# Patient Record
Sex: Male | Born: 1940
Health system: Southern US, Community
[De-identification: ages and names within clinical notes are randomized; demographics above are authoritative.]

## PROBLEM LIST (undated history)

## (undated) DIAGNOSIS — F1010 Alcohol abuse, uncomplicated: Secondary | ICD-10-CM

## (undated) DIAGNOSIS — E785 Hyperlipidemia, unspecified: Secondary | ICD-10-CM

## (undated) DIAGNOSIS — K5790 Diverticulosis of intestine, part unspecified, without perforation or abscess without bleeding: Secondary | ICD-10-CM

## (undated) DIAGNOSIS — F03A0 Unspecified dementia, mild, without behavioral disturbance, psychotic disturbance, mood disturbance, and anxiety: Secondary | ICD-10-CM

## (undated) DIAGNOSIS — G629 Polyneuropathy, unspecified: Secondary | ICD-10-CM

## (undated) DIAGNOSIS — I10 Essential (primary) hypertension: Secondary | ICD-10-CM

## (undated) DIAGNOSIS — G4733 Obstructive sleep apnea (adult) (pediatric): Secondary | ICD-10-CM

## (undated) DIAGNOSIS — I2510 Atherosclerotic heart disease of native coronary artery without angina pectoris: Secondary | ICD-10-CM

## (undated) HISTORY — DX: Obstructive sleep apnea (adult) (pediatric): G47.33

## (undated) HISTORY — DX: Polyneuropathy, unspecified: G62.9

## (undated) HISTORY — DX: Alcohol abuse, uncomplicated: F10.10

## (undated) HISTORY — PX: COLON SURGERY: SHX602

## (undated) HISTORY — DX: Hyperlipidemia, unspecified: E78.5

## (undated) HISTORY — DX: Essential (primary) hypertension: I10

## (undated) HISTORY — DX: Atherosclerotic heart disease of native coronary artery without angina pectoris: I25.10

## (undated) HISTORY — PX: TONSILLECTOMY: SUR1361

---

## 1998-01-25 ENCOUNTER — Emergency Department (HOSPITAL_COMMUNITY): Admission: EM | Admit: 1998-01-25 | Discharge: 1998-01-25 | Payer: Self-pay | Admitting: Emergency Medicine

## 1998-03-04 ENCOUNTER — Inpatient Hospital Stay (HOSPITAL_COMMUNITY): Admission: EM | Admit: 1998-03-04 | Discharge: 1998-03-10 | Payer: Self-pay | Admitting: Emergency Medicine

## 1998-03-19 ENCOUNTER — Inpatient Hospital Stay (HOSPITAL_COMMUNITY): Admission: EM | Admit: 1998-03-19 | Discharge: 1998-03-28 | Payer: Self-pay | Admitting: *Deleted

## 1998-05-07 ENCOUNTER — Encounter: Admission: RE | Admit: 1998-05-07 | Discharge: 1998-08-05 | Payer: Self-pay | Admitting: *Deleted

## 1999-07-09 ENCOUNTER — Ambulatory Visit (HOSPITAL_BASED_OUTPATIENT_CLINIC_OR_DEPARTMENT_OTHER): Admission: RE | Admit: 1999-07-09 | Discharge: 1999-07-09 | Payer: Self-pay | Admitting: *Deleted

## 1999-10-06 ENCOUNTER — Ambulatory Visit (HOSPITAL_BASED_OUTPATIENT_CLINIC_OR_DEPARTMENT_OTHER): Admission: RE | Admit: 1999-10-06 | Discharge: 1999-10-06 | Payer: Self-pay | Admitting: *Deleted

## 2003-01-23 ENCOUNTER — Ambulatory Visit (HOSPITAL_COMMUNITY): Admission: RE | Admit: 2003-01-23 | Discharge: 2003-01-23 | Payer: Self-pay | Admitting: Gastroenterology

## 2003-09-18 ENCOUNTER — Inpatient Hospital Stay (HOSPITAL_COMMUNITY): Admission: EM | Admit: 2003-09-18 | Discharge: 2003-09-22 | Payer: Self-pay | Admitting: Emergency Medicine

## 2003-09-18 IMAGING — CR DG ABDOMEN ACUTE W/ 1V CHEST
3 series · 3 of 3 positions shown · non-contrast
Comparison: none.

CLINICAL DATA: right-sided abdominal pain
 ACUTE ABDOMEN SERIES (TWO VIEW ABDOMEN WITH ONE VIEW CHEST) [DATE] at [2E] hours:

[view not recorded (1 of 3)]
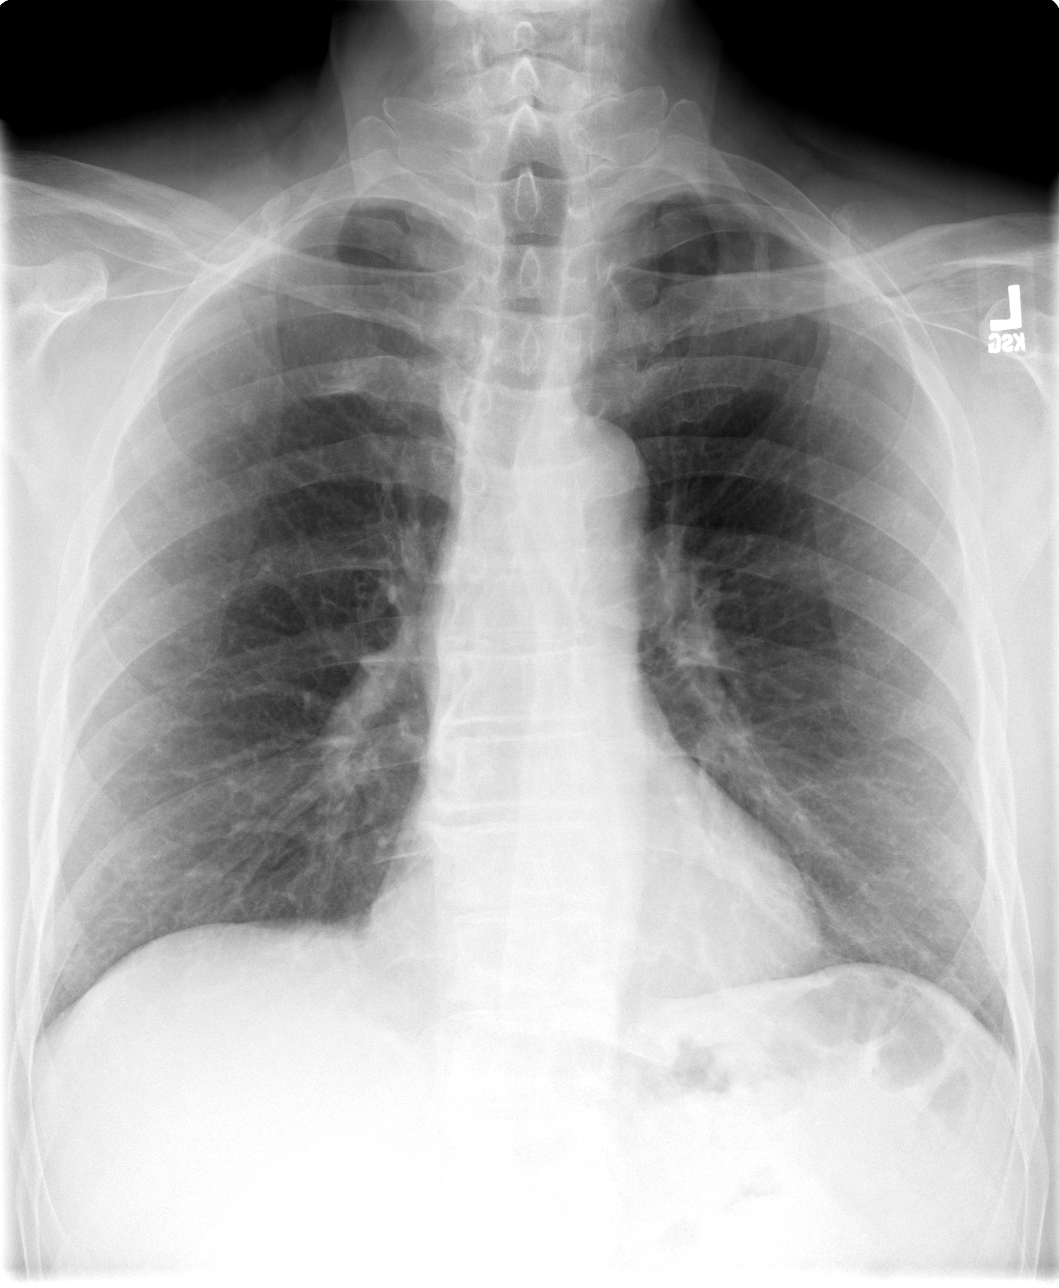

[view not recorded (2 of 3)]
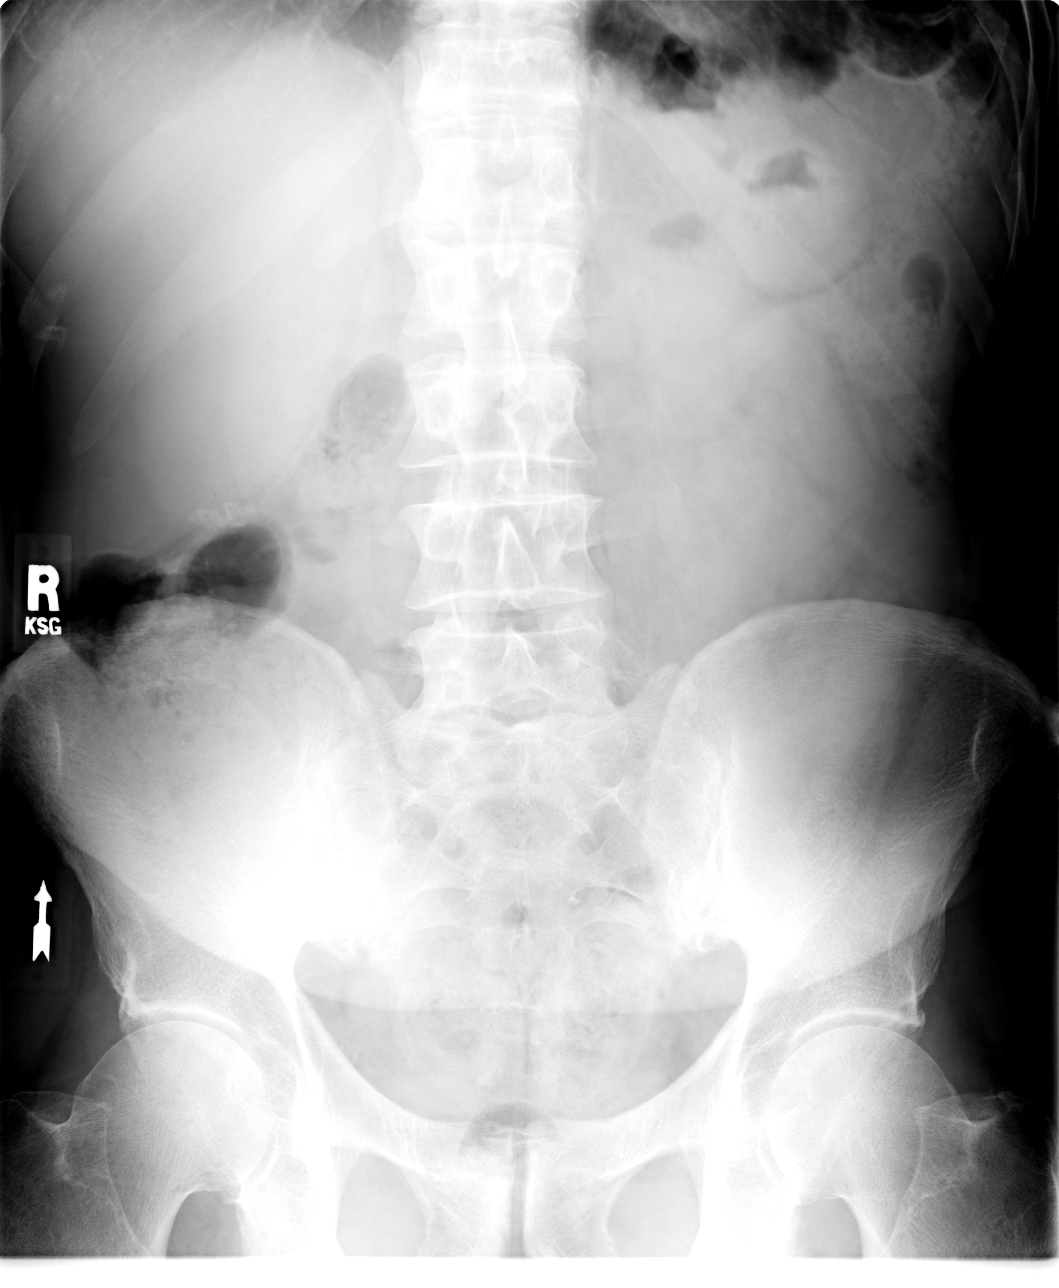

[view not recorded (3 of 3)]
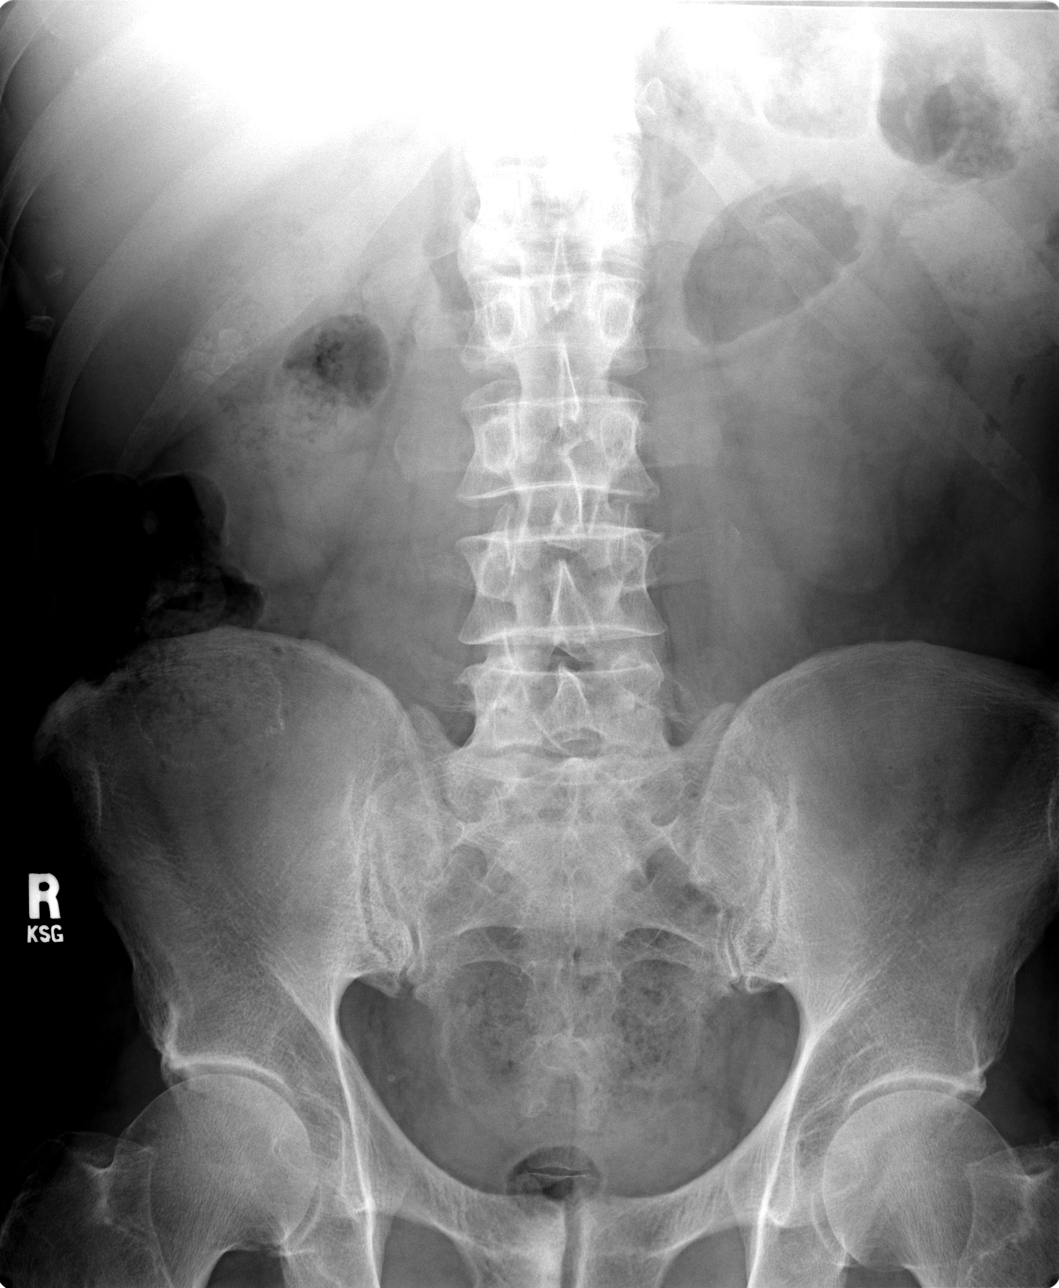

[3 of 3 positions shown; findings below may reference images not displayed]

The bowel gas pattern is unremarkable and there is no evidence of obstruction or free intraperitoneal air.  Surgical anastomotic suture material is noted in the right midabdomen in the region of the ascending colon.  Calcified gallstones are present within the gallbladder.  No opaque urinary tract calculi are identified.  Psoas margins are visible.  Mild degenerative changes are present in the lumbar spine.  
 The accompanying PA chest film demonstrates a normal heart size.  Thoracic aorta is minimally tortuous.  The lungs appear clear.  
 IMPRESSION
 Cholelithiasis.  No acute abdominal abnormalities.  
 No acute cardiopulmonary disease.

## 2003-09-18 IMAGING — CT CT ABDOMEN W/ CM
1 of 4 series · 14 of 32 positions shown, 19 images · non-contrast
Comparison: None.

CLINICAL DATA: Right lower quadrant pain.  The patient is status post surgery for ruptured diverticulum six years ago.  Leukocytosis.
 CT ABDOMEN AND PELVIS [DATE]
TECHNIQUE: Contiguous 5 mm axial images were obtained from the lung bases through the pubic symphysis after administration of oral and 100 cc of nonionic intravenous contrast.  
 CT ABDOMEN
 Subcentimeter low density lesion identified in the inferior right liver and lateral segment of the left liver, too small to characterize.  The spleen is unremarkable.  The stomach is distended.  The duodenum and pancreas have normal features.  Multiple stones are seen in the gallbladder lumen.  Adrenal glands and kidneys are unremarkable.  
 IMPRESSION
 1.  Two small low density lesions are too small to characterize.  
 2.  Cholelithiasis.
 CT PELVIS
 Imaging through the anatomic pelvis demonstrates extensive interstitial edema within the sigmoid mesentery.  There is colonic wall thickening in the sigmoid colon with several small locules of extraluminal air within the mesenteric fat adjacent to the inflamed bowel.  There is no evidence for focal or rim enhancing fluid collection to suggest abscess.  
 Anastomotic suture line seen in the right colon.
 Prominent inflammatory change around the mid sigmoid colon with several tiny locules of extraluminal gas.  Features are compatible with marked sigmoid diverticulitis and contained perforation.  There is no evidence for abscess at this time.  The patient reports a history of recent colonoscopy, and correlation is recommended as neoplasm can present with similar features.

[Series 3: abd/pelvis 5.0 b30f · axial · 0.67mm/px · z∈[-316,+38]mm · 14 of 81 slices shown, 19 images]
[im 5/81  soft-tissue]
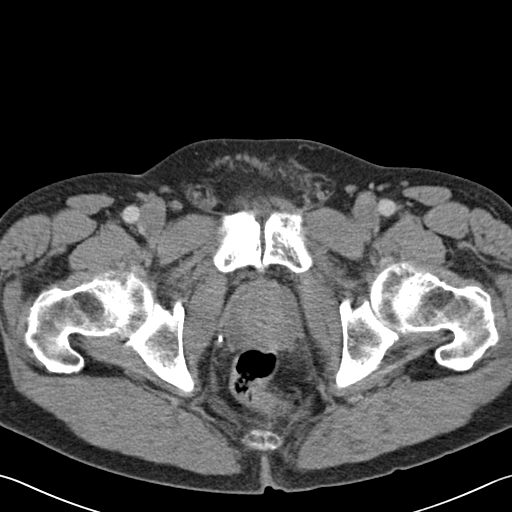
[im 5/81  bone]
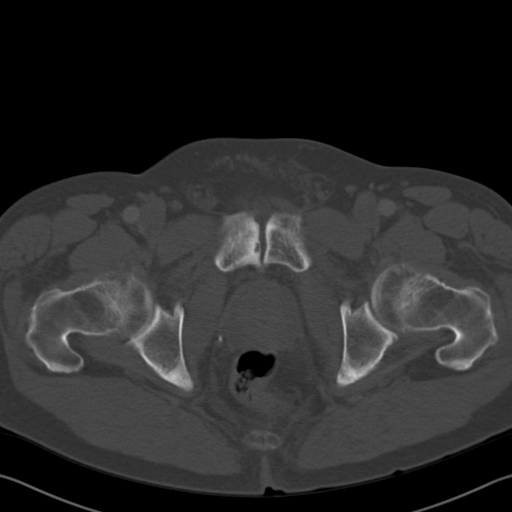
[im 10/81  soft-tissue]
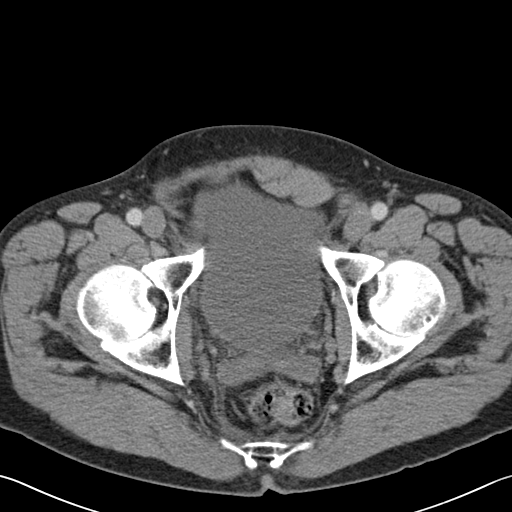
[im 19/81  soft-tissue]
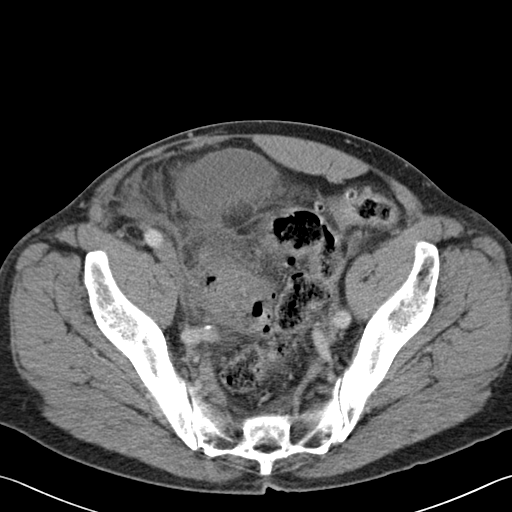
[im 24/81  soft-tissue]
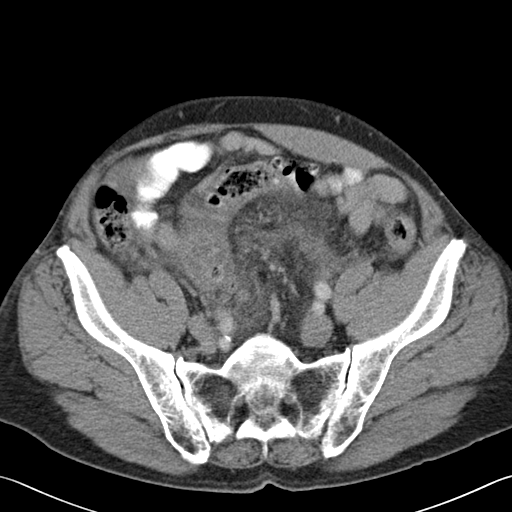
[im 29/81  soft-tissue]
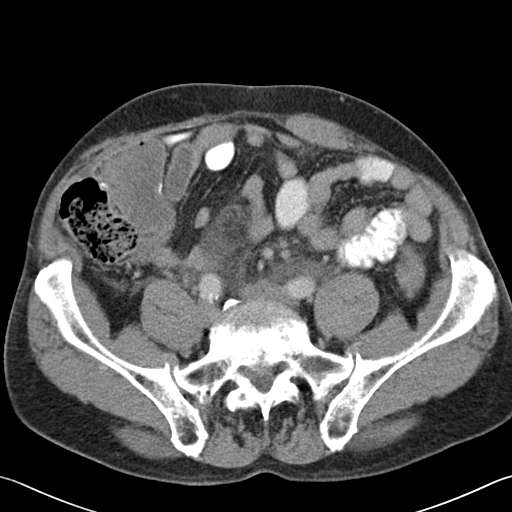
[im 33/81  soft-tissue]
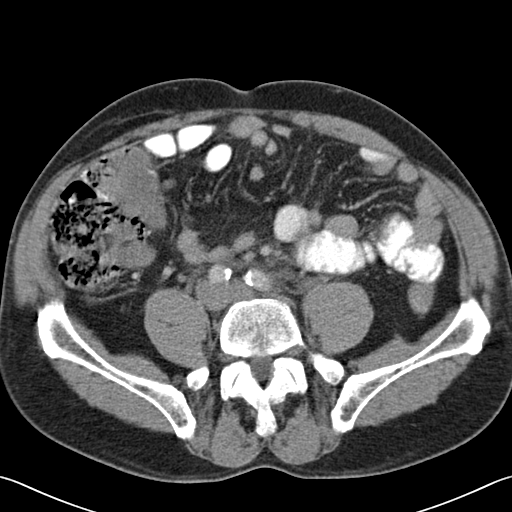
[im 43/81  soft-tissue]
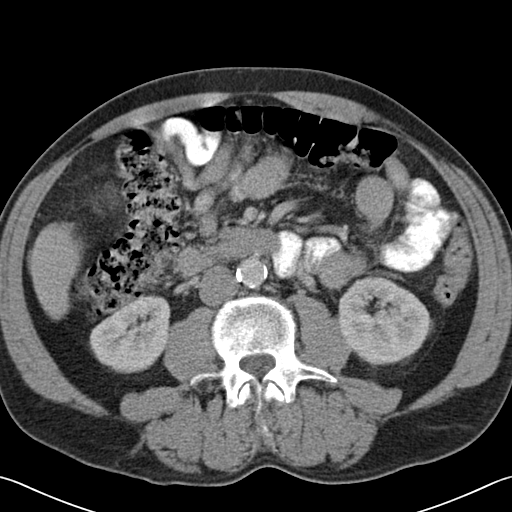
[im 48/81  soft-tissue]
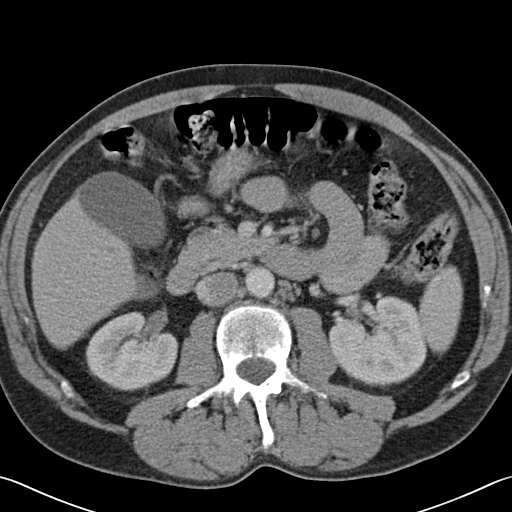
[im 52/81  soft-tissue]
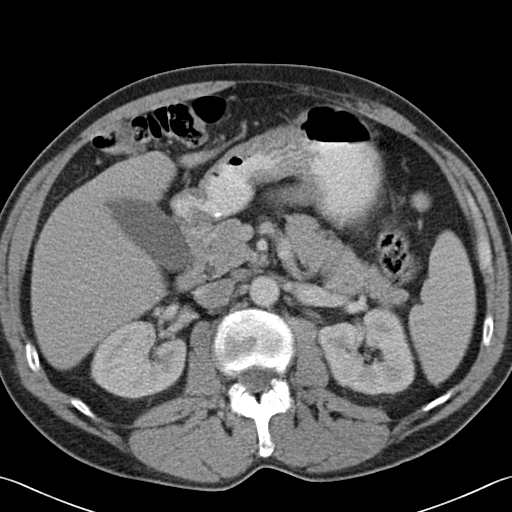
[im 52/81  bone]
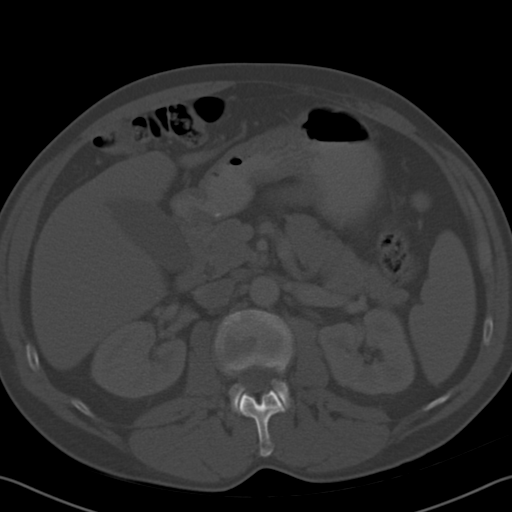
[im 57/81  soft-tissue]
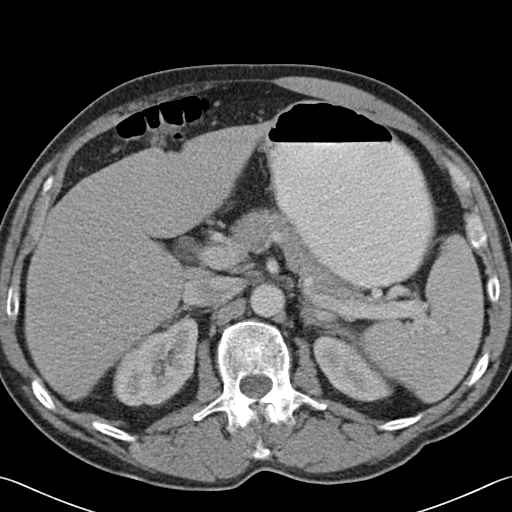
[im 62/81  soft-tissue]
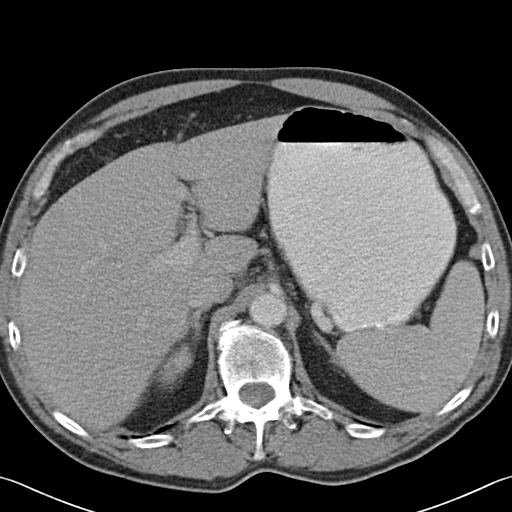
[im 62/81  lung]
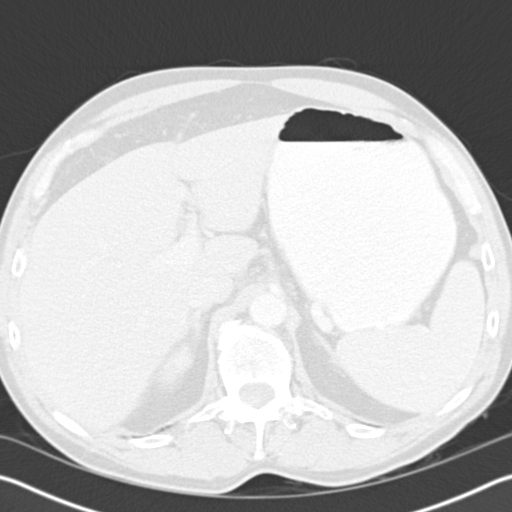
[im 66/81  lung]
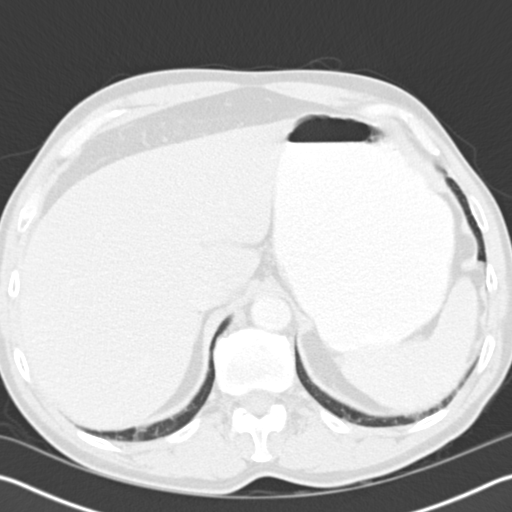
[im 71/81  soft-tissue]
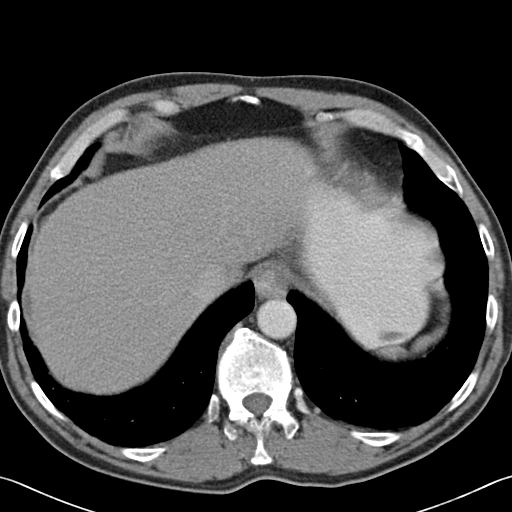
[im 71/81  lung]
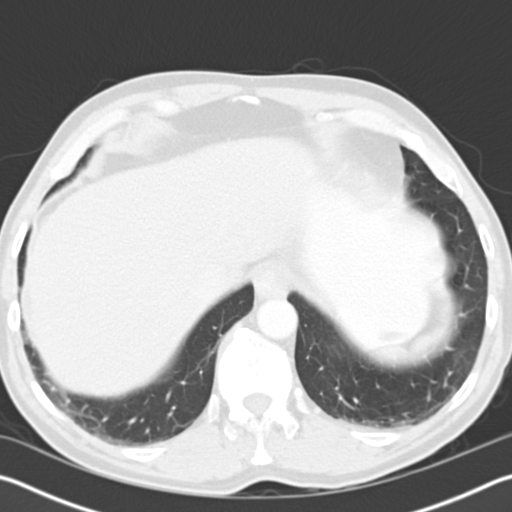
[im 76/81  soft-tissue]
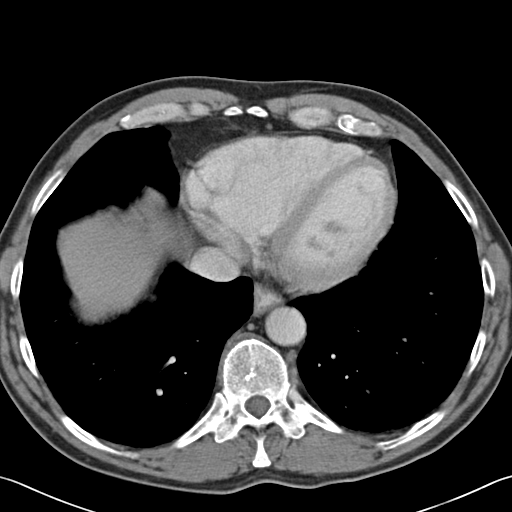
[im 76/81  lung]
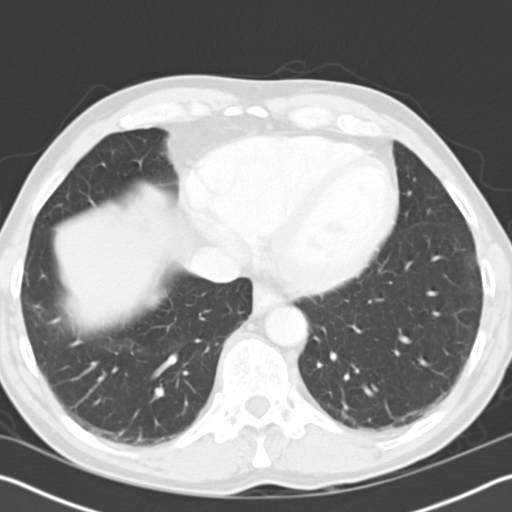

[14 of 32 positions shown; findings below may reference images not displayed]

## 2003-09-20 IMAGING — CT CT PELVIS W/ CM
1 of 4 series · 13 of 32 positions shown, 19 images · IV contrast (omnipaque)
Comparison: [DATE].

CLINICAL DATA: Diverticulitis, follow-up. 
 CT ABDOMEN AND PELVIS WITH CONTRAST
TECHNIQUE: Multidetector helical CT imaging of abdomen and pelvis performed following dilute oral contrast and 100 cc Omnipaque 300.

[Series 2: abd/pelvis 5.0 b30f · axial · 0.74mm/px · z∈[-604,-229]mm · 13 of 89 slices shown, 19 images]
[im 7/89  soft-tissue]
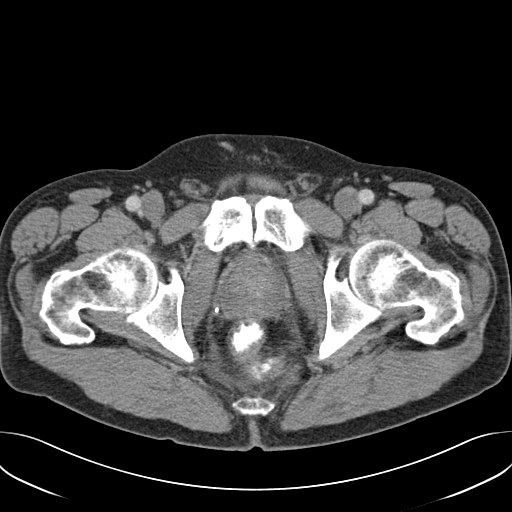
[im 7/89  bone]
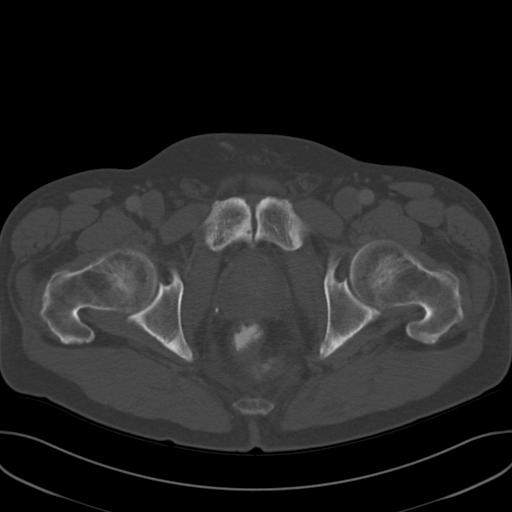
[im 13/89  soft-tissue]
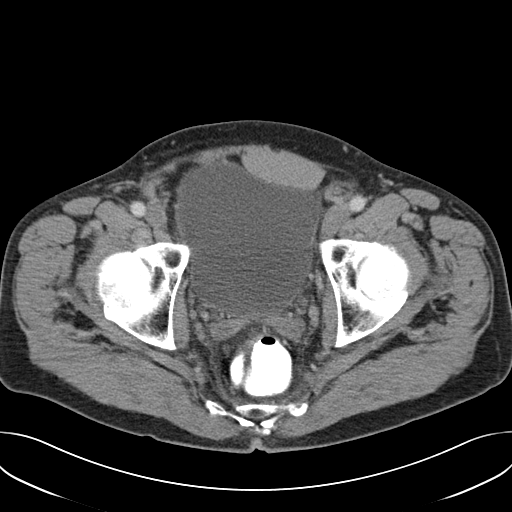
[im 19/89  soft-tissue]
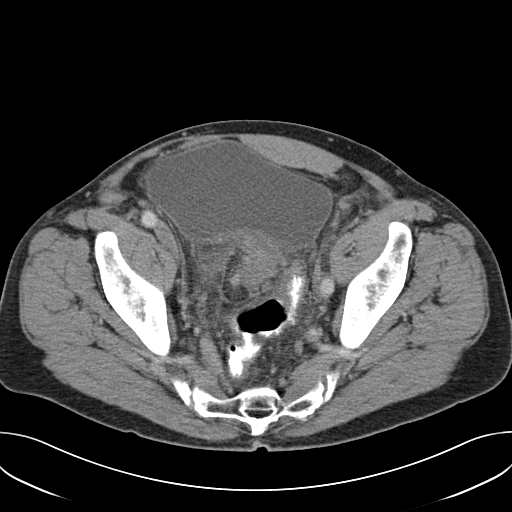
[im 26/89  soft-tissue]
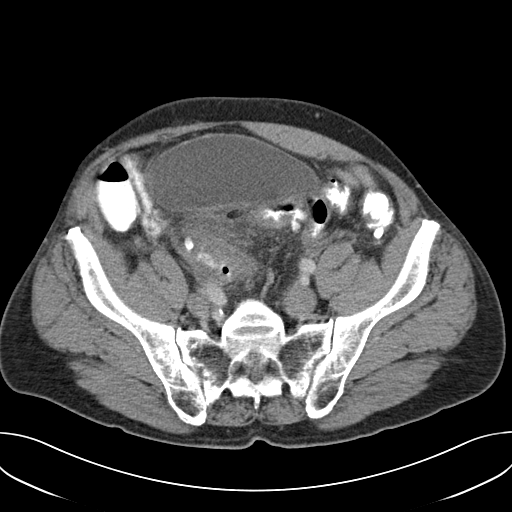
[im 32/89  soft-tissue]
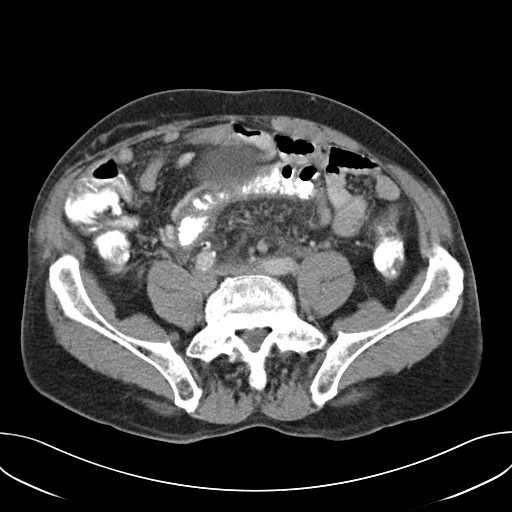
[im 38/89  soft-tissue]
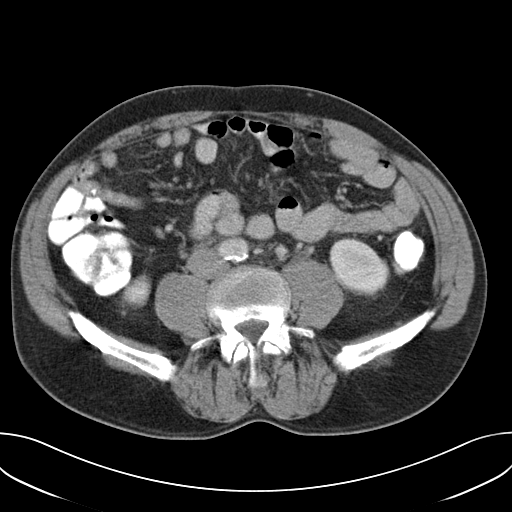
[im 45/89  soft-tissue]
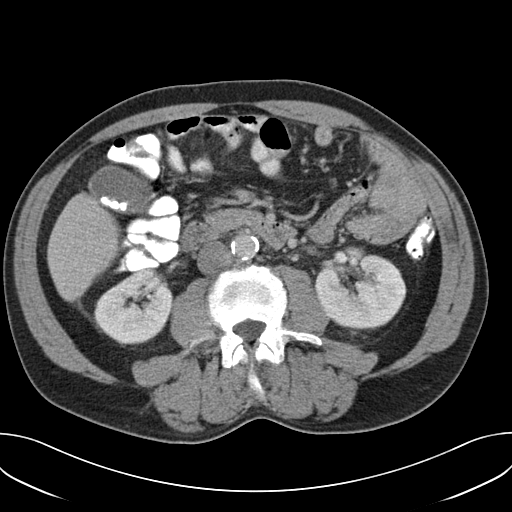
[im 51/89  soft-tissue]
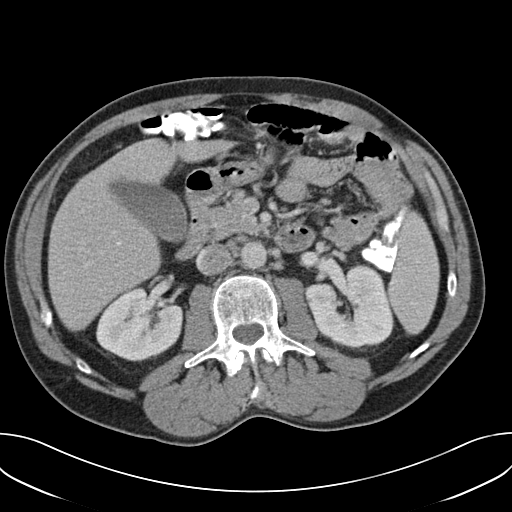
[im 57/89  soft-tissue]
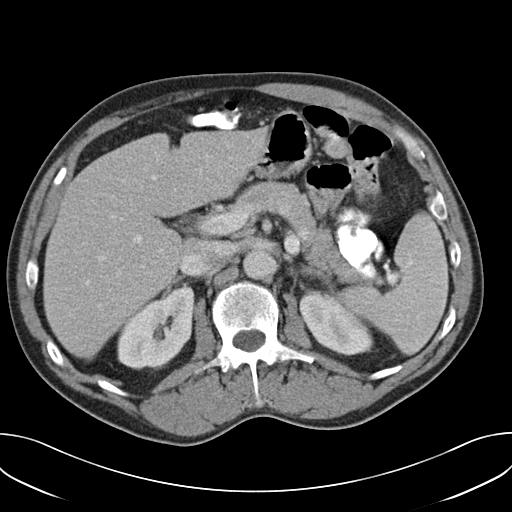
[im 57/89  bone]
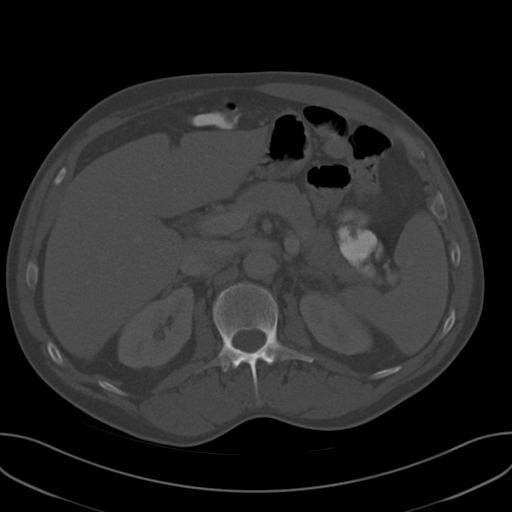
[im 63/89  soft-tissue]
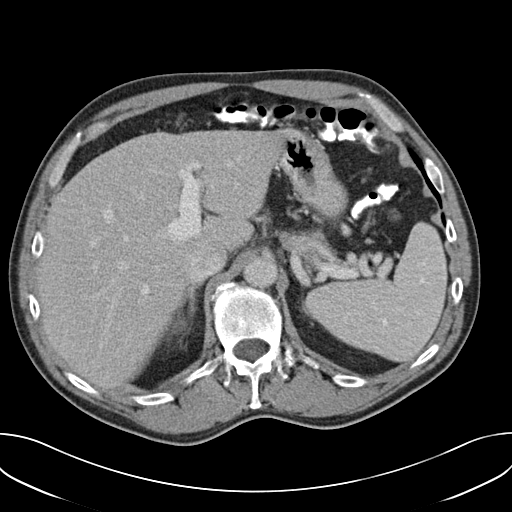
[im 63/89  lung]
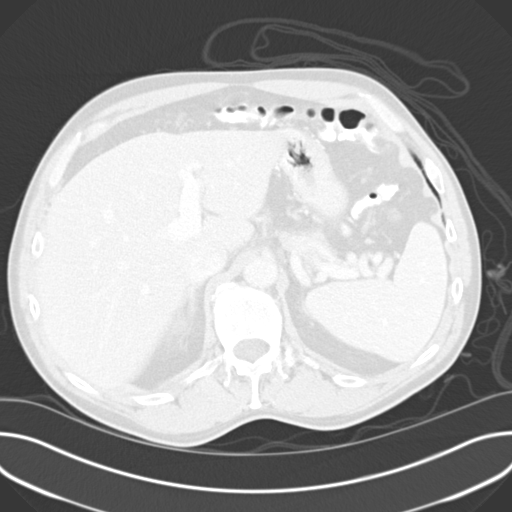
[im 70/89  soft-tissue]
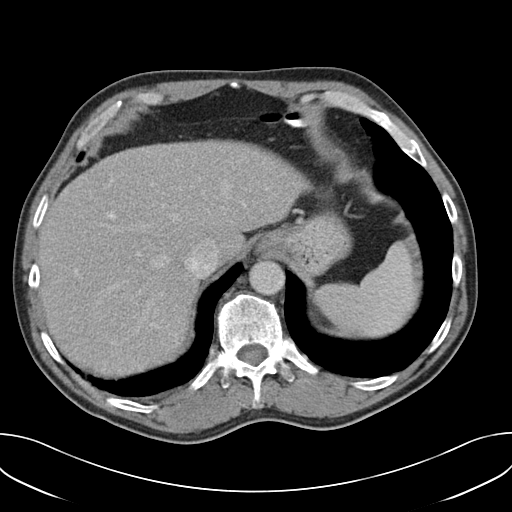
[im 70/89  lung]
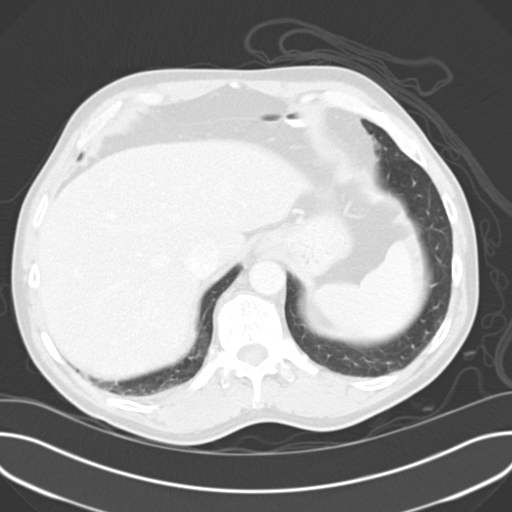
[im 76/89  soft-tissue]
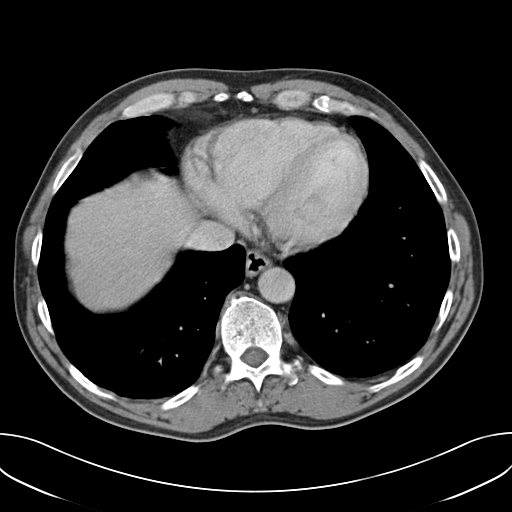
[im 76/89  lung]
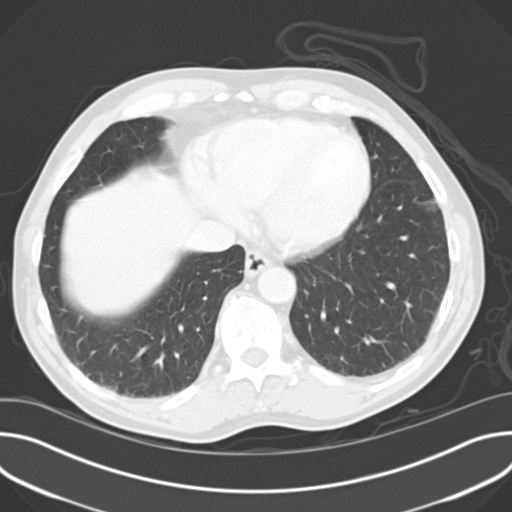
[im 82/89  soft-tissue]
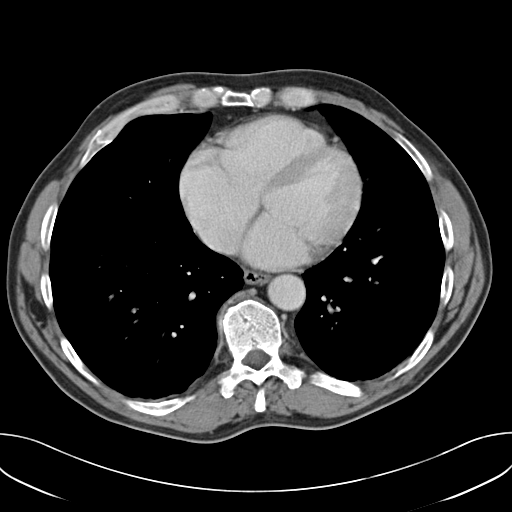
[im 82/89  lung]
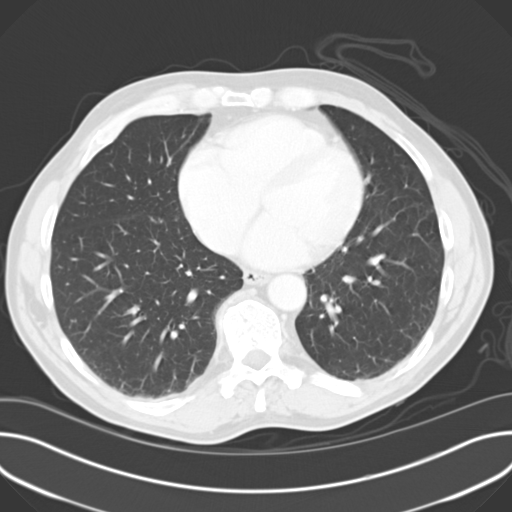

[13 of 32 positions shown; findings below may reference images not displayed]

FINDINGS: CT ABDOMEN 
 Lung bases clear.  Small splenule anterior to spleen, 2.1 cm diameter (image #29).  Tiny nonspecific low attenuation focus, lateral side, mid left lobe of liver unchanged, 6 mm diameter (image #34).  Cholelithiasis.  Additional tiny low attenuation focus inferiorly right lobe liver, unchanged, probably tiny cyst.  Spleen, pancreas, kidneys, and adrenal glands unremarkable.  Bowel loops of upper abdomen unremarkable.  
 IMPRESSION
 Tiny low attenuation foci of liver.  No acute abnormalities.
 CT PELVIS
 Extensive thickening of the sigmoid colon compatible with marked sigmoid diverticulitis.  The inflammatory changes appear slightly improved when compared to the previous exam.  Again identified extraluminal gas, right anterolateral to the sigmoid colon, centered image #67, probably representing early abscess formation.  This does not have a well-defined wall, and the inflammatory changes at this site measure approximately 4.4 x 3.5 cm in size.  Oral contrast is present in the rectum.  No contrast extravasation seen.  The sigmoid colon is markedly redundant with marked wall thickening of the inflamed segment left lateral to the extraluminal collection.  Bladder unremarkable.  
 IMPRESSION
 Severe diverticulitis changes, with slight decrease in inflammatory changes, a persistent extraluminal gas collection is seen probably representing early abscess formation.  Marked thickening of the wall of the involved portion of the sigmoid colon, with overall markedly redundant sigmoid colon.

## 2003-10-30 ENCOUNTER — Ambulatory Visit (HOSPITAL_COMMUNITY): Admission: RE | Admit: 2003-10-30 | Discharge: 2003-10-30 | Payer: Self-pay | Admitting: *Deleted

## 2003-10-30 IMAGING — RF DG BE W/ CM - WO/W KUB
15 of 24 series · 15 of 24 positions shown · non-contrast
Comparison: none

CLINICAL DATA: 62-year-old male with repeated diverticulitis. 
SINGLE CONTRAST BARIUM ENEMA

[Series 1: run · 1 of 1 slices shown (1 of 12)]
[im 1/1]
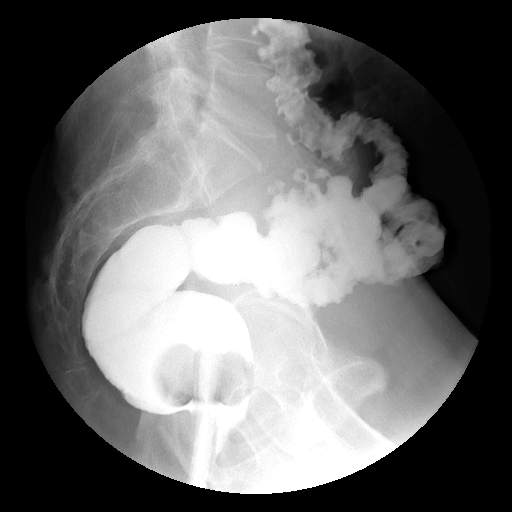

[Series 3: run · 1 of 1 slices shown (2 of 12)]
[im 1/1]
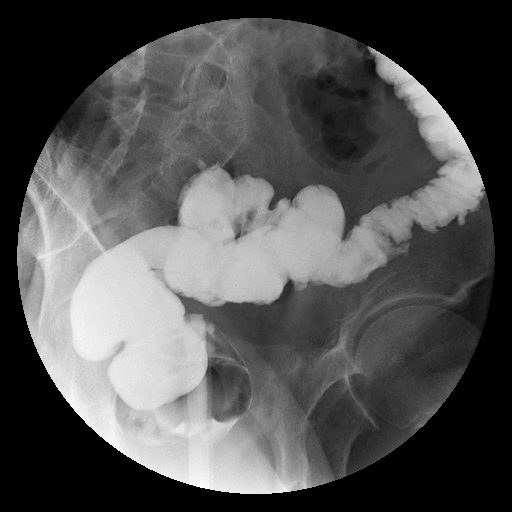

[Series 5: run · 1 of 1 slices shown (3 of 12)]
[im 1/1]
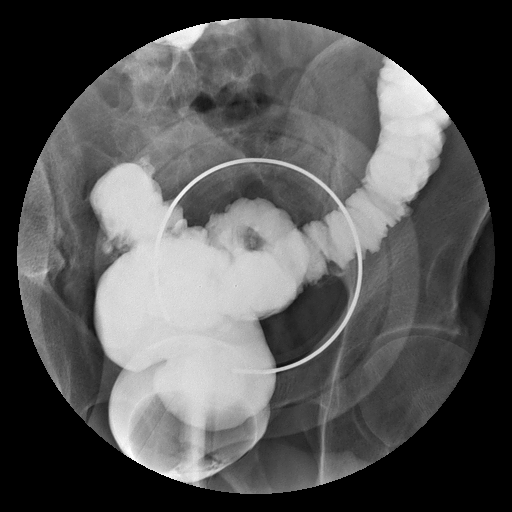

[Series 6: run · 1 of 1 slices shown (4 of 12)]
[im 1/1]
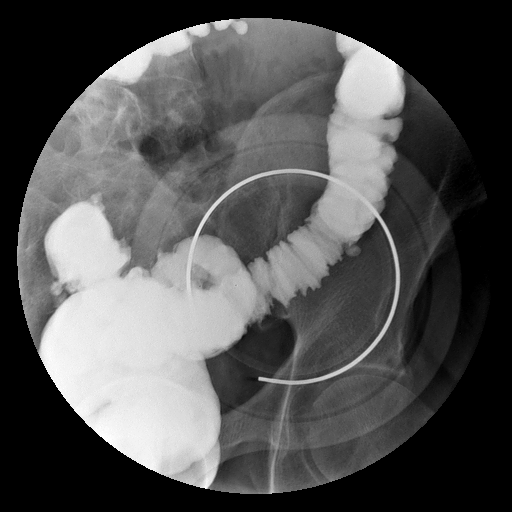

[Series 8: run · 1 of 1 slices shown (5 of 12)]
[im 1/1]
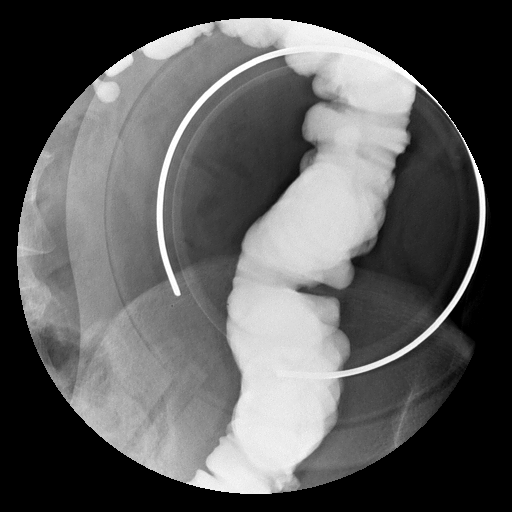

[Series 9: run · 1 of 1 slices shown (6 of 12)]
[im 1/1]
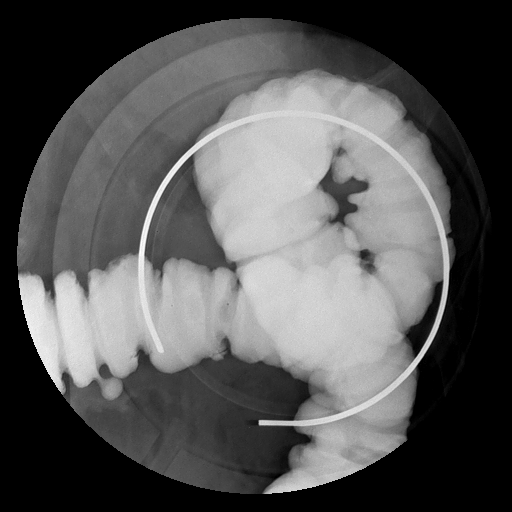

[Series 11: run · 1 of 1 slices shown (7 of 12)]
[im 1/1]
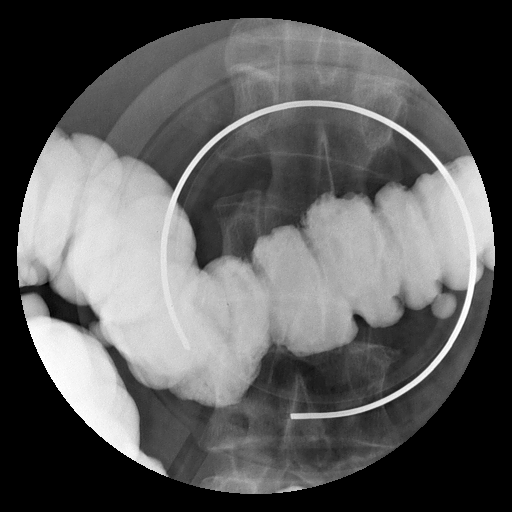

[Series 13: run · 1 of 1 slices shown (8 of 12)]
[im 1/1]
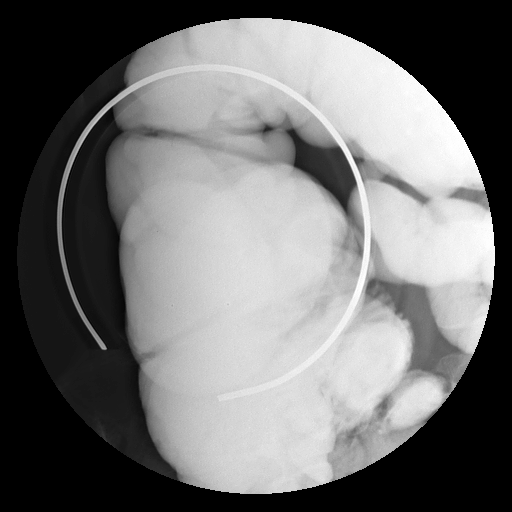

[Series 14: run · 1 of 1 slices shown (9 of 12)]
[im 1/1]
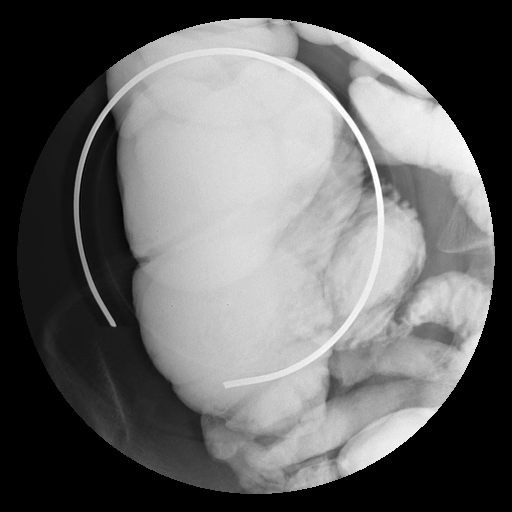

[Series 16: run · 1 of 1 slices shown (10 of 12)]
[im 1/1]
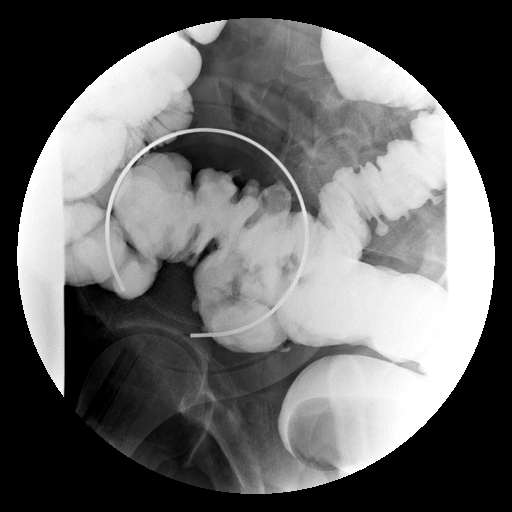

[Series 17: run · 1 of 1 slices shown (11 of 12)]
[im 1/1]
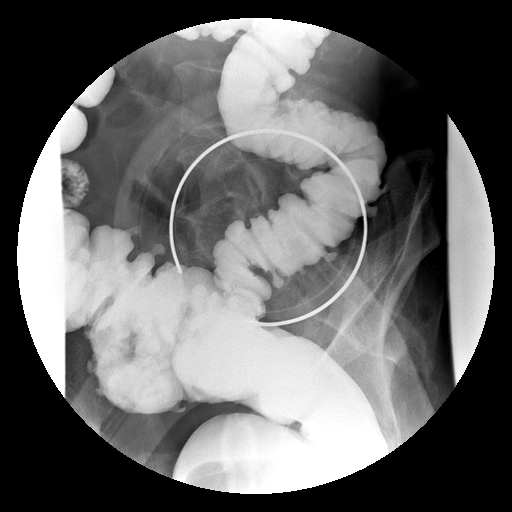

[Series 19: run · 1 of 1 slices shown (12 of 12)]
[im 1/1]
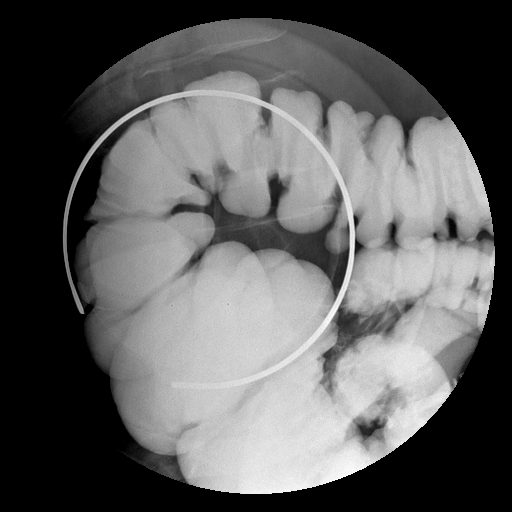

[Series 1001: view not recorded · 0.20mm/px · 1 of 1 slices shown (1 of 3)]
[im 1/1]
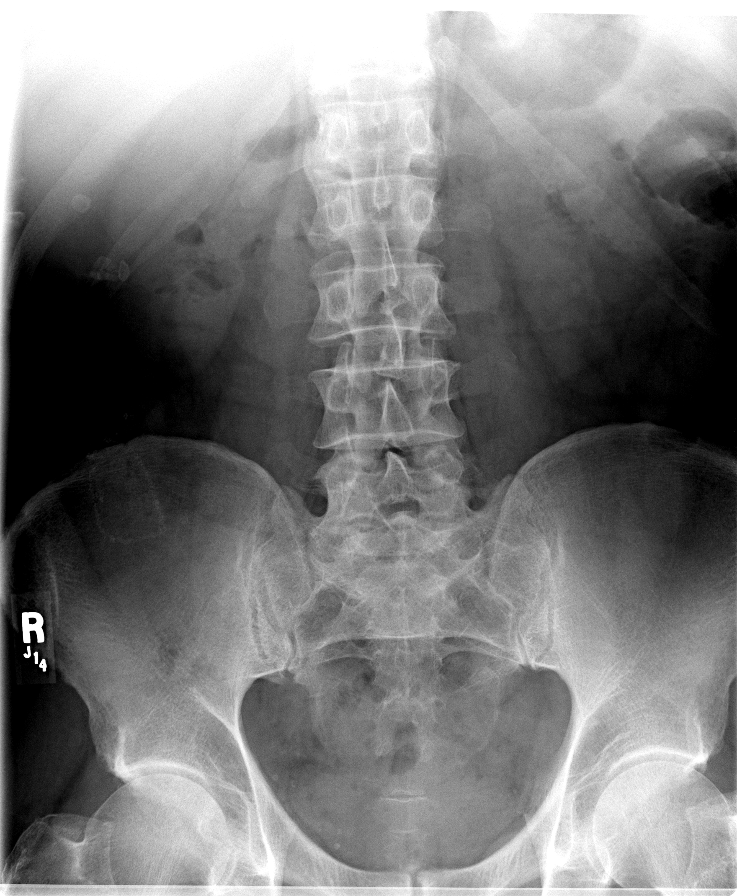

[Series 1002: view not recorded · 0.20mm/px · 1 of 1 slices shown (2 of 3)]
[im 1/1]
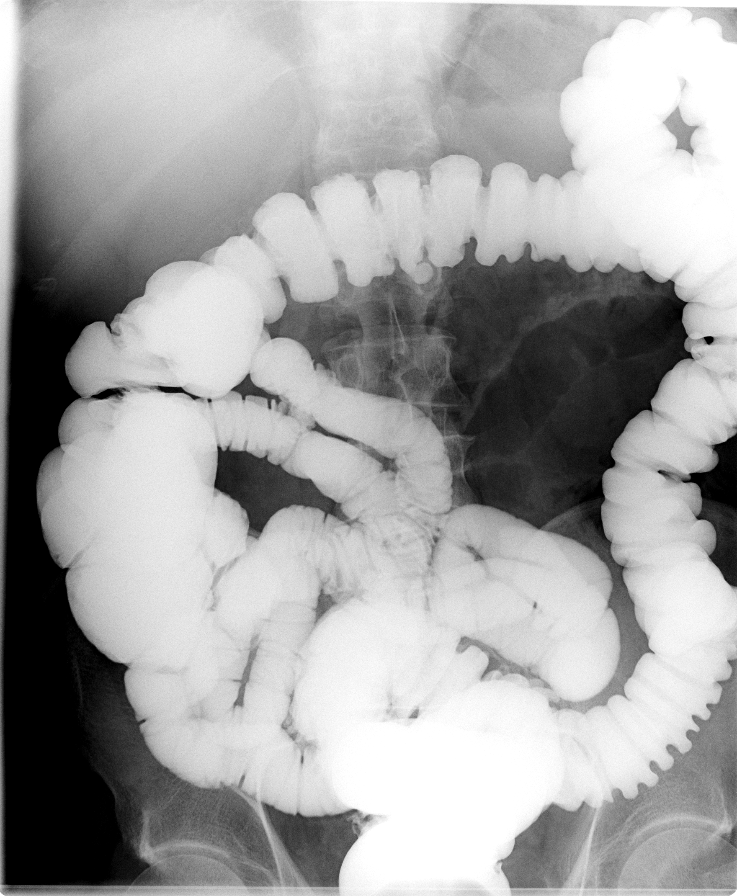

[Series 1004: view not recorded · 0.20mm/px · 1 of 1 slices shown (3 of 3)]
[im 1/1]
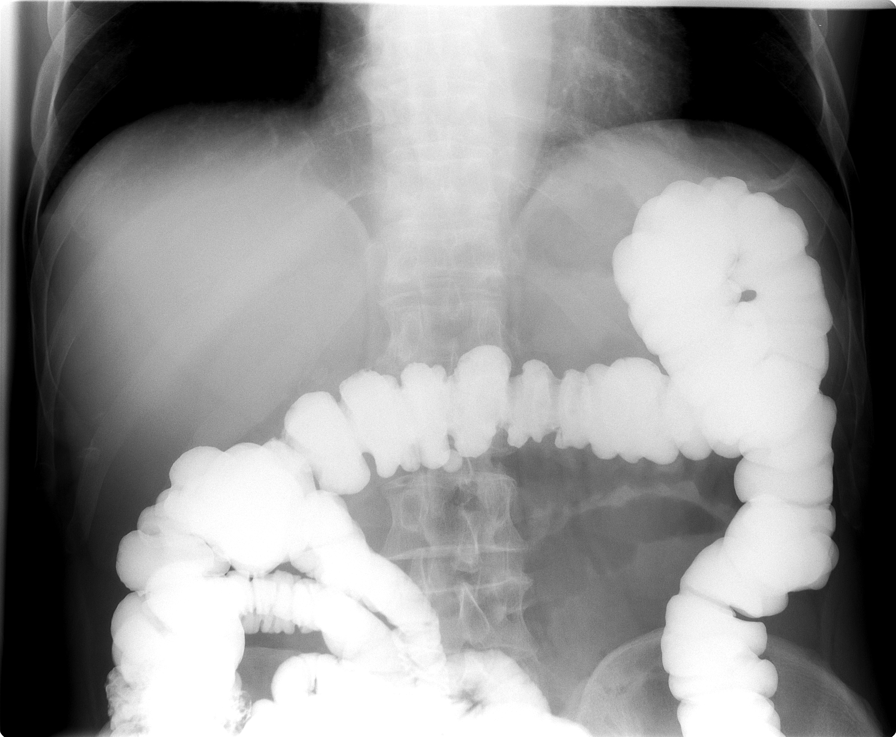

[15 of 24 positions shown; findings below may reference images not displayed]

FINDINGS: Barium was administered via the rectum via low gravity drip.  Multiple diverticula in the sigmoid colon and distal descending colon noted.  There may be mild muscular hypertrophy/thickening of the sigmoid colon consistent with chronic diverticular disease.  A few scattered diverticula in the transverse colon and at the splenic flexure are also noted.  There are no areas of fixed filling defects, areas of fixed narrowing, or definite mucosal abnormalities.  Reflux into the small bowel is noted. 
IMPRESSION
Colonic diverticulosis, primarily within the sigmoid colon with changes of mild muscular hypertrophy/wall thickening.

## 2004-01-08 ENCOUNTER — Inpatient Hospital Stay (HOSPITAL_COMMUNITY): Admission: RE | Admit: 2004-01-08 | Discharge: 2004-01-13 | Payer: Self-pay | Admitting: *Deleted

## 2011-03-22 ENCOUNTER — Emergency Department (HOSPITAL_COMMUNITY)
Admission: EM | Admit: 2011-03-22 | Discharge: 2011-03-22 | Disposition: A | Discharge status: Home / Self Care | Payer: Medicare Other | Attending: Emergency Medicine | Admitting: Emergency Medicine

## 2011-03-22 DIAGNOSIS — IMO0002 Reserved for concepts with insufficient information to code with codable children: Secondary | ICD-10-CM | POA: Insufficient documentation

## 2011-03-22 DIAGNOSIS — S0100XA Unspecified open wound of scalp, initial encounter: Secondary | ICD-10-CM | POA: Insufficient documentation

## 2011-03-22 DIAGNOSIS — Y92009 Unspecified place in unspecified non-institutional (private) residence as the place of occurrence of the external cause: Secondary | ICD-10-CM | POA: Insufficient documentation

## 2011-09-13 DIAGNOSIS — H2510 Age-related nuclear cataract, unspecified eye: Secondary | ICD-10-CM | POA: Diagnosis not present

## 2011-10-18 DIAGNOSIS — M7710 Lateral epicondylitis, unspecified elbow: Secondary | ICD-10-CM | POA: Diagnosis not present

## 2011-11-01 DIAGNOSIS — IMO0002 Reserved for concepts with insufficient information to code with codable children: Secondary | ICD-10-CM | POA: Diagnosis not present

## 2011-11-03 DIAGNOSIS — IMO0002 Reserved for concepts with insufficient information to code with codable children: Secondary | ICD-10-CM | POA: Diagnosis not present

## 2011-11-15 DIAGNOSIS — M1710 Unilateral primary osteoarthritis, unspecified knee: Secondary | ICD-10-CM | POA: Diagnosis not present

## 2011-11-15 DIAGNOSIS — IMO0002 Reserved for concepts with insufficient information to code with codable children: Secondary | ICD-10-CM | POA: Diagnosis not present

## 2011-11-15 DIAGNOSIS — M2390 Unspecified internal derangement of unspecified knee: Secondary | ICD-10-CM | POA: Diagnosis not present

## 2012-01-28 ENCOUNTER — Encounter (HOSPITAL_COMMUNITY): Payer: Self-pay | Admitting: *Deleted

## 2012-01-28 ENCOUNTER — Emergency Department (HOSPITAL_COMMUNITY): Payer: Medicare Other

## 2012-01-28 ENCOUNTER — Emergency Department (HOSPITAL_COMMUNITY)
Admission: EM | Admit: 2012-01-28 | Discharge: 2012-01-28 | Disposition: A | Discharge status: Home / Self Care | Payer: Medicare Other | Attending: Emergency Medicine | Admitting: Emergency Medicine

## 2012-01-28 DIAGNOSIS — S61409A Unspecified open wound of unspecified hand, initial encounter: Secondary | ICD-10-CM | POA: Insufficient documentation

## 2012-01-28 DIAGNOSIS — W208XXA Other cause of strike by thrown, projected or falling object, initial encounter: Secondary | ICD-10-CM | POA: Insufficient documentation

## 2012-01-28 DIAGNOSIS — S61219A Laceration without foreign body of unspecified finger without damage to nail, initial encounter: Secondary | ICD-10-CM

## 2012-01-28 DIAGNOSIS — S61209A Unspecified open wound of unspecified finger without damage to nail, initial encounter: Secondary | ICD-10-CM | POA: Diagnosis not present

## 2012-01-28 HISTORY — DX: Diverticulosis of intestine, part unspecified, without perforation or abscess without bleeding: K57.90

## 2012-01-28 IMAGING — CR DG FINGER MIDDLE 2+V*L*
3 series · 3 of 3 positions shown · non-contrast
Comparison: None.

CLINICAL DATA: Finger injury, laceration.

LEFT MIDDLE FINGER 2+V

[x finger pa left]
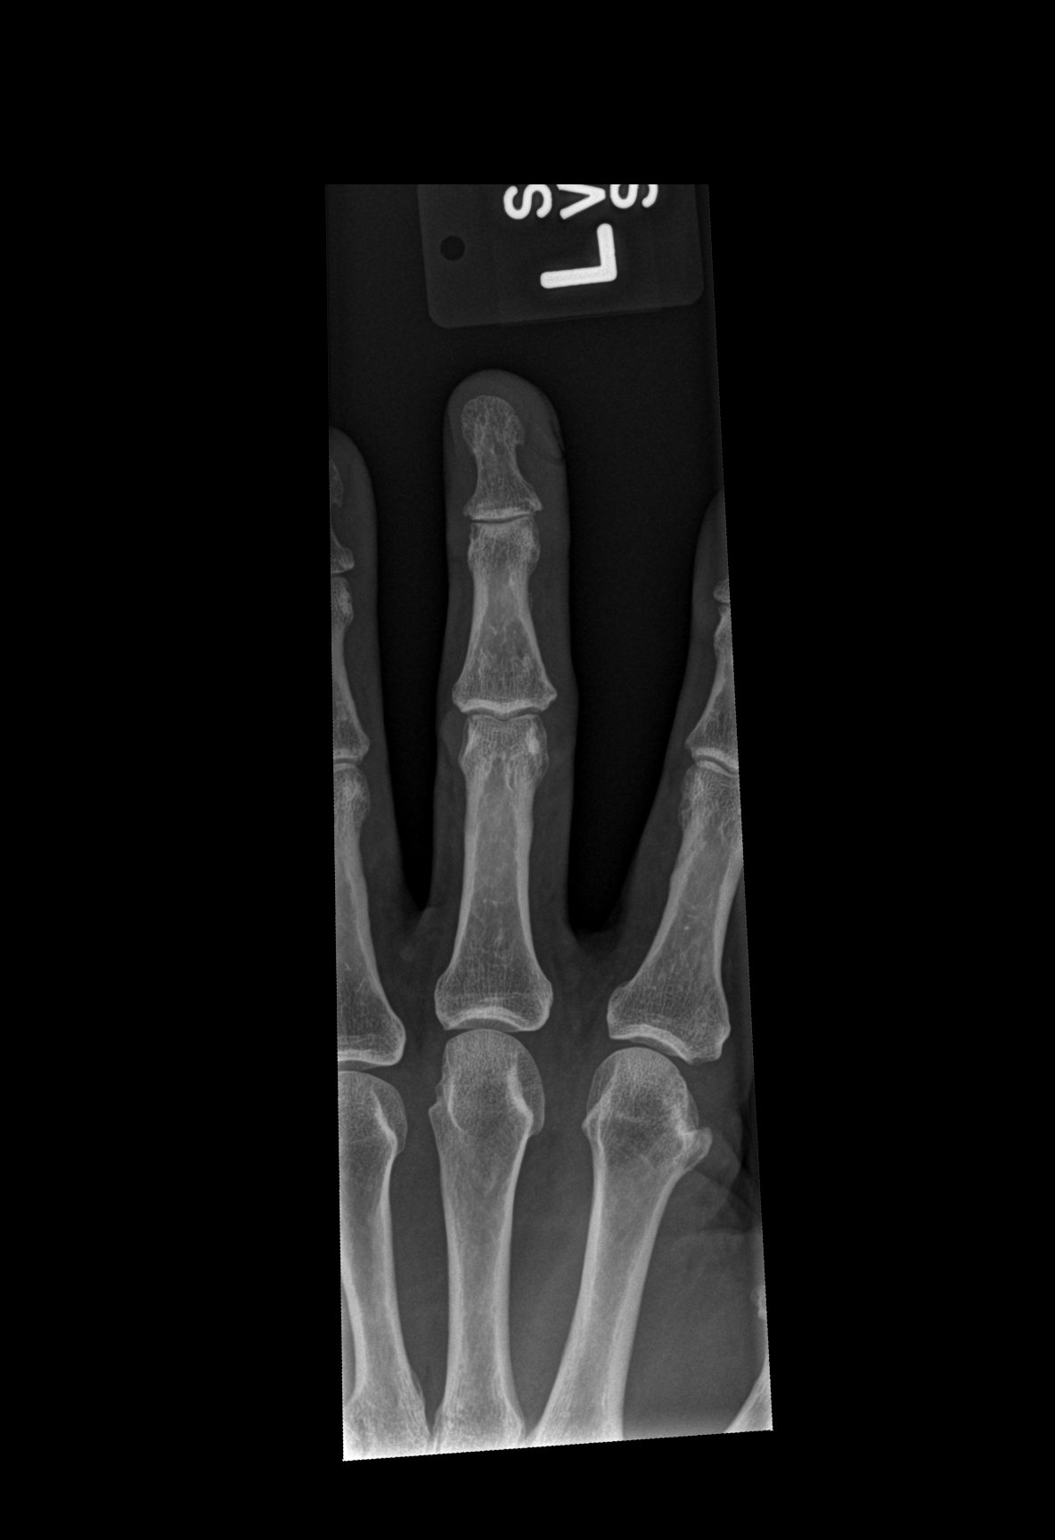

[x finger obl left]
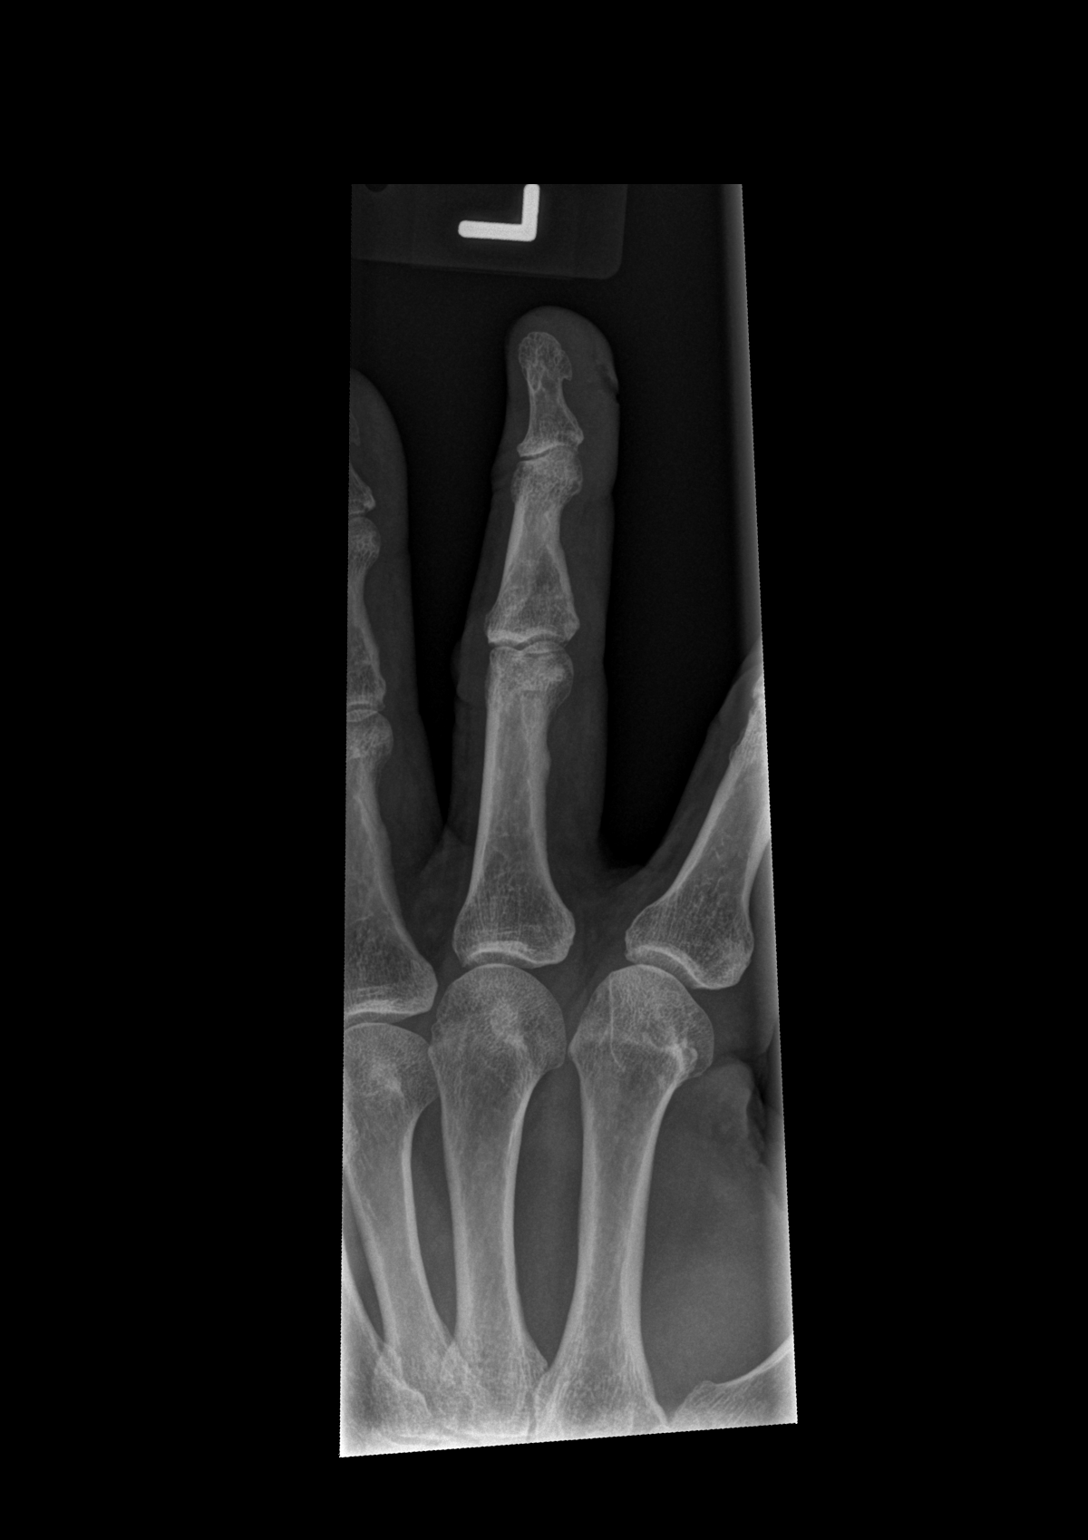

[x finger lat left]
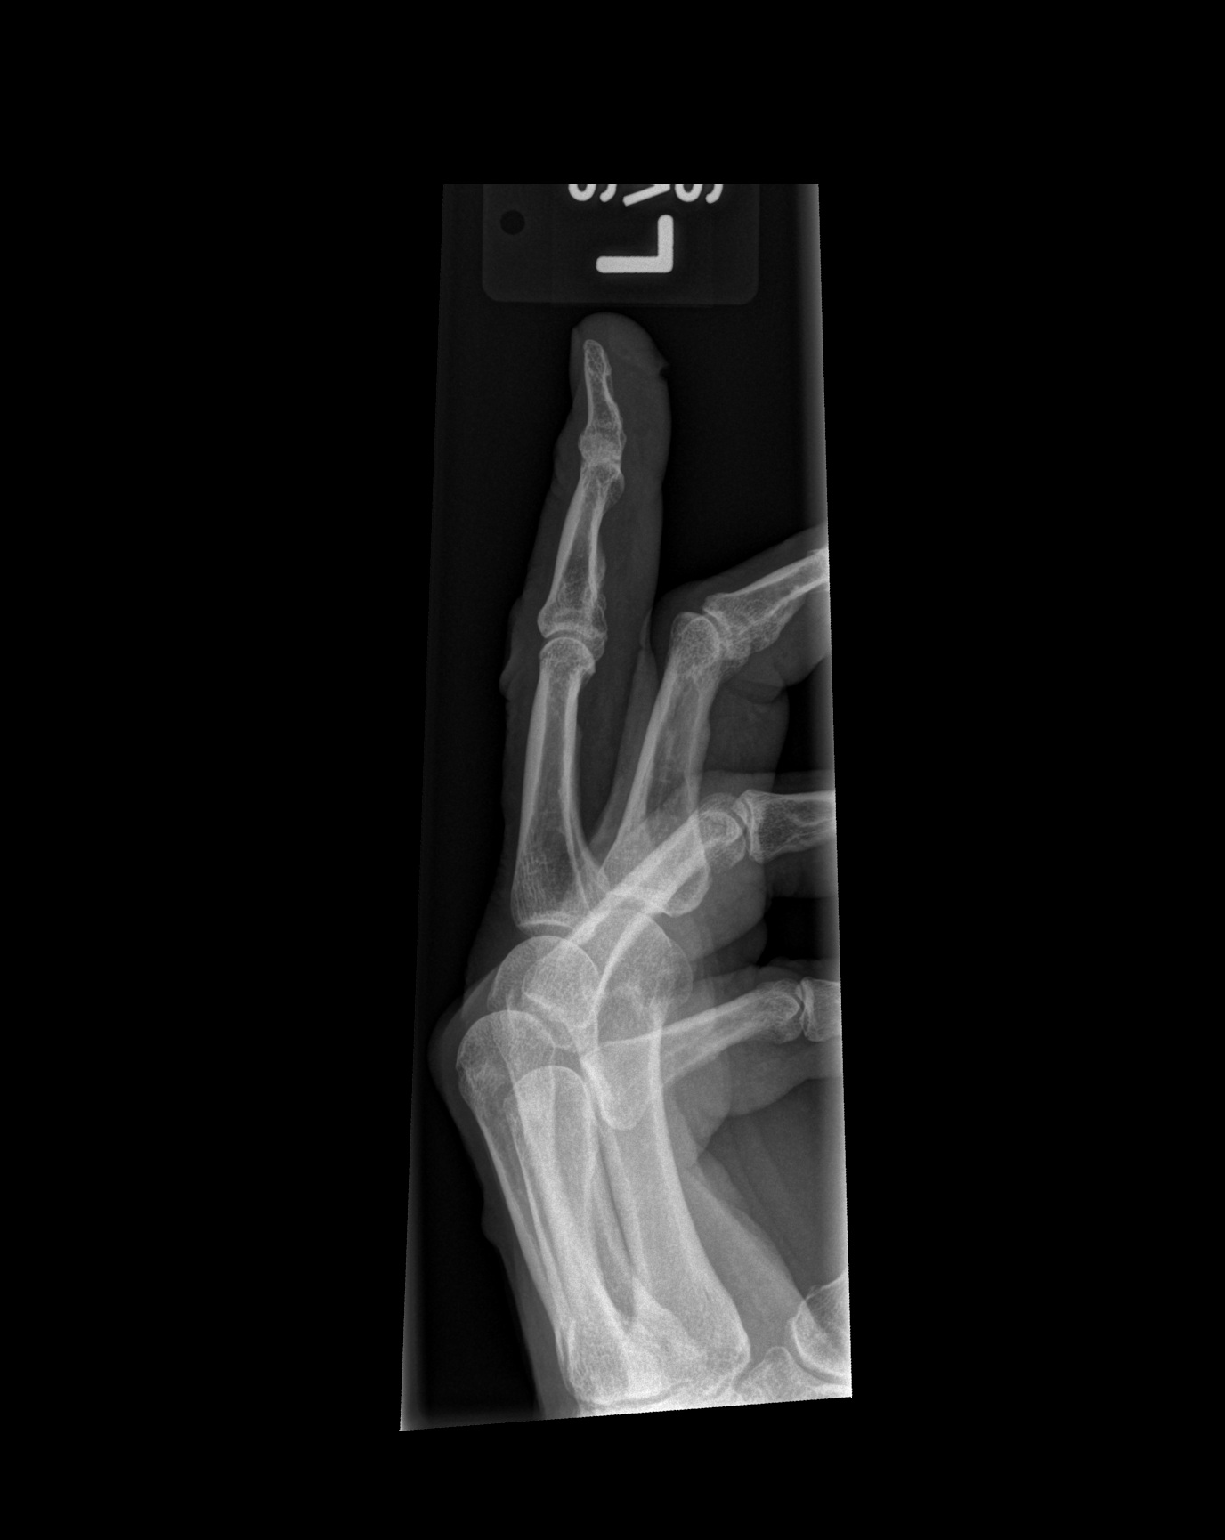

[3 of 3 positions shown; findings below may reference images not displayed]

FINDINGS: Soft tissue laceration over the tip of the left middle
finger. No acute bony abnormality.  Specifically, no fracture,
subluxation, or dislocation.  Soft tissues are intact.
Degenerative changes in the DIP joint.  No radiopaque foreign
bodies.
IMPRESSION: No acute bony abnormality.

## 2012-01-28 MED ORDER — HYDROCODONE-ACETAMINOPHEN 5-325 MG PO TABS: 2.0000 | ORAL_TABLET | ORAL | Status: AC | PRN

## 2012-01-28 NOTE — ED Provider Notes (Signed)
Seen by me patient suffered injury to left middle finger tip after a window close on his finger. No other injury. No treatment prior to coming On exam there is a jagged laceration to the distal phalanx left middle finger palmar aspect. There also is a subungual hematoma covering approximately 1/2 of the nailbed. Finger Range of motion good capillary refill  Doug Sou, MD 01/28/12 1304

## 2012-01-28 NOTE — ED Provider Notes (Signed)
History     CSN: [id number]  Arrival date & time 01/28/12  1101   First MD Initiated Contact with Patient 01/28/12 1113      Chief Complaint  Patient presents with  . Finger Injury  . Laceration    (Consider location/radiation/quality/duration/timing/severity/associated sxs/prior treatment) HPI Comments: Patient reports that just prior to arrival a window frame fell on the palmar surface of his left 3rd digit.  He now has a flap like laceration of the finger pad of the left 3rd digit.  Bleeding well controlled.   He has full ROM of his finger.  No numbness or tingling.  He reports that his tetanus is UTD.    Patient is a 71 y.o. male presenting with skin laceration. The history is provided by the patient.  Laceration  The laceration is 3 cm in size. He reports no foreign bodies present. His tetanus status is UTD.    Past Medical History  Diagnosis Date  . Diverticular disease     Past Surgical History  Procedure Date  . Colon surgery   . Tonsillectomy     No family history on file.  History  Substance Use Topics  . Smoking status: Never Smoker   . Smokeless tobacco: Not on file  . Alcohol Use: 3.0 oz/week    5 Cans of beer per week      Review of Systems  Constitutional: Negative for fever and chills.  Gastrointestinal: Negative for nausea and vomiting.  Musculoskeletal: Negative for joint swelling.  Skin: Positive for wound.       laceration  All other systems reviewed and are negative.    Allergies  Review of patient's allergies indicates no known allergies.  Home Medications   Current Outpatient Rx  Name Route Sig Dispense Refill  . ADULT MULTIVITAMIN W/MINERALS CH Oral Take 1 tablet by mouth daily.    . OMEGA-3-ACID ETHYL ESTERS 1 G PO CAPS Oral Take 1 g by mouth daily.      BP 145/80  Pulse 86  Temp 98.2 F (36.8 C) (Oral)  Resp 18  Wt 184 lb (83.462 kg)  SpO2 98%  Physical Exam  Nursing note and vitals reviewed. Constitutional: He  appears well-developed and well-nourished. No distress.  HENT:  Head: Normocephalic and atraumatic.  Cardiovascular: Normal rate, regular rhythm, normal heart sounds and intact distal pulses.   Pulses:      Radial pulses are 2+ on the right side, and 2+ on the left side.  Pulmonary/Chest: Effort normal and breath sounds normal. No respiratory distress. He has no wheezes.  Musculoskeletal: Normal range of motion.       Full ROM of the DIP of the left 3rd digit  Neurological: He is alert. No sensory deficit.  Skin: Skin is warm and dry. He is not diaphoretic.       U shaped flap laceration on the palmar surface of the 3rd left finger over the finger pad  Psychiatric: He has a normal mood and affect.    ED Course  Procedures (including critical care time)  Labs Reviewed - No data to display Dg Finger Middle Left  01/28/2012  *RADIOLOGY REPORT*  Clinical Data: Finger injury, laceration.  LEFT MIDDLE FINGER 2+V  Comparison: None.  Findings: Soft tissue laceration over the tip of the left middle finger. No acute bony abnormality.  Specifically, no fracture, subluxation, or dislocation.  Soft tissues are intact. Degenerative changes in the DIP joint.  No radiopaque foreign bodies.  IMPRESSION: No acute  bony abnormality.  Original Report Authenticated By: Cyndie Chime, M.D.     No diagnosis found.  LACERATION REPAIR Performed by: Anne Shutter, Pedram Goodchild Authorized by: Anne Shutter, Herbert Seta Consent: Verbal consent obtained. Risks and benefits: risks, benefits and alternatives were discussed Consent given by: patient Patient identity confirmed: provided demographic data Prepped and Draped in normal sterile fashion Wound explored  Laceration Location: left 3rd digit  Laceration Length: 3 cm  No Foreign Bodies seen or palpated  Anesthesia: Digital block  Local anesthetic: lidocaine 1% without epinephrine  Anesthetic total: 6 ml  Irrigation method: syringe Amount of cleaning:  standard  Skin closure: 3-0 Prolene  Number of sutures: 4  Technique: Simple interrrupted  Patient tolerance: Patient tolerated the procedure well with no immediate complications.  MDM  Patient presenting with laceration of the distal palmar surface of the left 3rd digit.  No apparent tendon damage.  Full ROM of the DIP.  Patient is neurovascularly intact.  Laceration cleaned out really well with Normal saline.  No foreign bodies visualized.  Laceration sutured without difficulties.  Patient discharged home and instructed to follow up with his PCP or Urgent Care in a few days to have wound rechecked.  Patient also given short course of pain medication.        Pascal Lux [place], PA-C 01/28/12 [phone]

## 2012-01-28 NOTE — ED Provider Notes (Signed)
Medical screening examination/treatment/procedure(s) were conducted as a shared visit with non-physician practitioner(s) and myself.  I personally evaluated the patient during the encounter  Doug Sou, MD 01/28/12 [phone]

## 2012-01-28 NOTE — ED Notes (Signed)
Pt has laceration to palmar side of left middle finger. Bleeding controlled. Laceration elliptical in shape. Pt states a window frame landed on finger tip.  Pt reports tetanus is up to date.

## 2012-01-28 NOTE — Discharge Instructions (Signed)
Do not drive or operate heavy machinery while taking pain medication.  Take pain medication as needed for severe pain.

## 2012-02-04 DIAGNOSIS — S61209A Unspecified open wound of unspecified finger without damage to nail, initial encounter: Secondary | ICD-10-CM | POA: Diagnosis not present

## 2012-02-04 DIAGNOSIS — Z4802 Encounter for removal of sutures: Secondary | ICD-10-CM | POA: Diagnosis not present

## 2012-03-27 DIAGNOSIS — H2510 Age-related nuclear cataract, unspecified eye: Secondary | ICD-10-CM | POA: Diagnosis not present

## 2012-04-19 DIAGNOSIS — H2510 Age-related nuclear cataract, unspecified eye: Secondary | ICD-10-CM | POA: Diagnosis not present

## 2012-04-19 DIAGNOSIS — D3130 Benign neoplasm of unspecified choroid: Secondary | ICD-10-CM | POA: Diagnosis not present

## 2012-05-15 DIAGNOSIS — H2510 Age-related nuclear cataract, unspecified eye: Secondary | ICD-10-CM | POA: Diagnosis not present

## 2012-05-16 DIAGNOSIS — Z23 Encounter for immunization: Secondary | ICD-10-CM | POA: Diagnosis not present

## 2012-05-22 DIAGNOSIS — H2510 Age-related nuclear cataract, unspecified eye: Secondary | ICD-10-CM | POA: Diagnosis not present

## 2012-05-29 DIAGNOSIS — H269 Unspecified cataract: Secondary | ICD-10-CM | POA: Diagnosis not present

## 2012-05-29 DIAGNOSIS — H5200 Hypermetropia, unspecified eye: Secondary | ICD-10-CM | POA: Diagnosis not present

## 2012-05-29 DIAGNOSIS — H5231 Anisometropia: Secondary | ICD-10-CM | POA: Diagnosis not present

## 2012-05-29 DIAGNOSIS — H2510 Age-related nuclear cataract, unspecified eye: Secondary | ICD-10-CM | POA: Diagnosis not present

## 2012-07-29 DIAGNOSIS — K130 Diseases of lips: Secondary | ICD-10-CM | POA: Diagnosis not present

## 2012-08-24 DIAGNOSIS — K130 Diseases of lips: Secondary | ICD-10-CM | POA: Diagnosis not present

## 2012-09-11 DIAGNOSIS — Z125 Encounter for screening for malignant neoplasm of prostate: Secondary | ICD-10-CM | POA: Diagnosis not present

## 2012-09-11 DIAGNOSIS — Z131 Encounter for screening for diabetes mellitus: Secondary | ICD-10-CM | POA: Diagnosis not present

## 2012-09-11 DIAGNOSIS — Z136 Encounter for screening for cardiovascular disorders: Secondary | ICD-10-CM | POA: Diagnosis not present

## 2012-09-11 DIAGNOSIS — M25469 Effusion, unspecified knee: Secondary | ICD-10-CM | POA: Diagnosis not present

## 2012-09-22 ENCOUNTER — Other Ambulatory Visit: Payer: Self-pay | Admitting: Dermatology

## 2012-09-22 DIAGNOSIS — L821 Other seborrheic keratosis: Secondary | ICD-10-CM | POA: Diagnosis not present

## 2012-09-22 DIAGNOSIS — L570 Actinic keratosis: Secondary | ICD-10-CM | POA: Diagnosis not present

## 2012-09-22 DIAGNOSIS — D485 Neoplasm of uncertain behavior of skin: Secondary | ICD-10-CM | POA: Diagnosis not present

## 2012-09-22 DIAGNOSIS — I781 Nevus, non-neoplastic: Secondary | ICD-10-CM | POA: Diagnosis not present

## 2012-09-22 DIAGNOSIS — D239 Other benign neoplasm of skin, unspecified: Secondary | ICD-10-CM | POA: Diagnosis not present

## 2012-10-12 DIAGNOSIS — L570 Actinic keratosis: Secondary | ICD-10-CM | POA: Diagnosis not present

## 2012-10-16 DIAGNOSIS — H1044 Vernal conjunctivitis: Secondary | ICD-10-CM | POA: Diagnosis not present

## 2013-01-17 ENCOUNTER — Other Ambulatory Visit: Payer: Self-pay | Admitting: Dermatology

## 2013-01-17 DIAGNOSIS — L820 Inflamed seborrheic keratosis: Secondary | ICD-10-CM | POA: Diagnosis not present

## 2013-01-17 DIAGNOSIS — L570 Actinic keratosis: Secondary | ICD-10-CM | POA: Diagnosis not present

## 2013-01-17 DIAGNOSIS — D485 Neoplasm of uncertain behavior of skin: Secondary | ICD-10-CM | POA: Diagnosis not present

## 2013-02-14 DIAGNOSIS — M79609 Pain in unspecified limb: Secondary | ICD-10-CM | POA: Diagnosis not present

## 2013-02-14 DIAGNOSIS — Z1331 Encounter for screening for depression: Secondary | ICD-10-CM | POA: Diagnosis not present

## 2013-05-04 DIAGNOSIS — R209 Unspecified disturbances of skin sensation: Secondary | ICD-10-CM | POA: Diagnosis not present

## 2013-05-04 DIAGNOSIS — Z23 Encounter for immunization: Secondary | ICD-10-CM | POA: Diagnosis not present

## 2013-05-04 DIAGNOSIS — IMO0002 Reserved for concepts with insufficient information to code with codable children: Secondary | ICD-10-CM | POA: Diagnosis not present

## 2013-05-18 DIAGNOSIS — M545 Low back pain, unspecified: Secondary | ICD-10-CM | POA: Diagnosis not present

## 2013-05-21 DIAGNOSIS — M545 Low back pain, unspecified: Secondary | ICD-10-CM | POA: Diagnosis not present

## 2013-05-23 DIAGNOSIS — M545 Low back pain, unspecified: Secondary | ICD-10-CM | POA: Diagnosis not present

## 2013-06-04 DIAGNOSIS — M545 Low back pain, unspecified: Secondary | ICD-10-CM | POA: Diagnosis not present

## 2013-06-18 DIAGNOSIS — I70209 Unspecified atherosclerosis of native arteries of extremities, unspecified extremity: Secondary | ICD-10-CM | POA: Diagnosis not present

## 2013-06-18 DIAGNOSIS — M5137 Other intervertebral disc degeneration, lumbosacral region: Secondary | ICD-10-CM | POA: Diagnosis not present

## 2013-06-18 DIAGNOSIS — M79609 Pain in unspecified limb: Secondary | ICD-10-CM | POA: Diagnosis not present

## 2013-06-25 DIAGNOSIS — M79609 Pain in unspecified limb: Secondary | ICD-10-CM | POA: Diagnosis not present

## 2013-07-04 DIAGNOSIS — M79609 Pain in unspecified limb: Secondary | ICD-10-CM | POA: Diagnosis not present

## 2013-09-10 DIAGNOSIS — Z131 Encounter for screening for diabetes mellitus: Secondary | ICD-10-CM | POA: Diagnosis not present

## 2013-09-10 DIAGNOSIS — Z0000 Encounter for general adult medical examination without abnormal findings: Secondary | ICD-10-CM | POA: Diagnosis not present

## 2013-09-10 DIAGNOSIS — Z125 Encounter for screening for malignant neoplasm of prostate: Secondary | ICD-10-CM | POA: Diagnosis not present

## 2013-09-28 ENCOUNTER — Other Ambulatory Visit: Payer: Self-pay | Admitting: Dermatology

## 2013-09-28 DIAGNOSIS — Z808 Family history of malignant neoplasm of other organs or systems: Secondary | ICD-10-CM | POA: Diagnosis not present

## 2013-09-28 DIAGNOSIS — L821 Other seborrheic keratosis: Secondary | ICD-10-CM | POA: Diagnosis not present

## 2013-09-28 DIAGNOSIS — L570 Actinic keratosis: Secondary | ICD-10-CM | POA: Diagnosis not present

## 2013-09-28 DIAGNOSIS — D239 Other benign neoplasm of skin, unspecified: Secondary | ICD-10-CM | POA: Diagnosis not present

## 2013-09-28 DIAGNOSIS — D485 Neoplasm of uncertain behavior of skin: Secondary | ICD-10-CM | POA: Diagnosis not present

## 2013-09-28 DIAGNOSIS — L820 Inflamed seborrheic keratosis: Secondary | ICD-10-CM | POA: Diagnosis not present

## 2013-12-17 DIAGNOSIS — H1044 Vernal conjunctivitis: Secondary | ICD-10-CM | POA: Diagnosis not present

## 2014-06-07 DIAGNOSIS — Z23 Encounter for immunization: Secondary | ICD-10-CM | POA: Diagnosis not present

## 2014-09-16 DIAGNOSIS — R030 Elevated blood-pressure reading, without diagnosis of hypertension: Secondary | ICD-10-CM | POA: Diagnosis not present

## 2014-09-16 DIAGNOSIS — Z23 Encounter for immunization: Secondary | ICD-10-CM | POA: Diagnosis not present

## 2014-09-16 DIAGNOSIS — H833X3 Noise effects on inner ear, bilateral: Secondary | ICD-10-CM | POA: Diagnosis not present

## 2014-09-16 DIAGNOSIS — Z0000 Encounter for general adult medical examination without abnormal findings: Secondary | ICD-10-CM | POA: Diagnosis not present

## 2014-10-11 DIAGNOSIS — D1800 Hemangioma unspecified site: Secondary | ICD-10-CM | POA: Diagnosis not present

## 2014-10-11 DIAGNOSIS — Z86018 Personal history of other benign neoplasm: Secondary | ICD-10-CM | POA: Diagnosis not present

## 2014-10-11 DIAGNOSIS — D2272 Melanocytic nevi of left lower limb, including hip: Secondary | ICD-10-CM | POA: Diagnosis not present

## 2014-10-11 DIAGNOSIS — L821 Other seborrheic keratosis: Secondary | ICD-10-CM | POA: Diagnosis not present

## 2014-10-11 DIAGNOSIS — Z808 Family history of malignant neoplasm of other organs or systems: Secondary | ICD-10-CM | POA: Diagnosis not present

## 2014-12-16 DIAGNOSIS — H26493 Other secondary cataract, bilateral: Secondary | ICD-10-CM | POA: Diagnosis not present

## 2015-05-26 DIAGNOSIS — Z23 Encounter for immunization: Secondary | ICD-10-CM | POA: Diagnosis not present

## 2015-06-12 DIAGNOSIS — H26492 Other secondary cataract, left eye: Secondary | ICD-10-CM | POA: Diagnosis not present

## 2015-06-16 DIAGNOSIS — H26492 Other secondary cataract, left eye: Secondary | ICD-10-CM | POA: Diagnosis not present

## 2015-09-24 DIAGNOSIS — Z1211 Encounter for screening for malignant neoplasm of colon: Secondary | ICD-10-CM | POA: Diagnosis not present

## 2015-09-24 DIAGNOSIS — Z0000 Encounter for general adult medical examination without abnormal findings: Secondary | ICD-10-CM | POA: Diagnosis not present

## 2015-10-01 DIAGNOSIS — Z1211 Encounter for screening for malignant neoplasm of colon: Secondary | ICD-10-CM | POA: Diagnosis not present

## 2015-10-08 DIAGNOSIS — M545 Low back pain: Secondary | ICD-10-CM | POA: Diagnosis not present

## 2015-10-22 DIAGNOSIS — Z808 Family history of malignant neoplasm of other organs or systems: Secondary | ICD-10-CM | POA: Diagnosis not present

## 2015-10-22 DIAGNOSIS — L820 Inflamed seborrheic keratosis: Secondary | ICD-10-CM | POA: Diagnosis not present

## 2015-10-22 DIAGNOSIS — D485 Neoplasm of uncertain behavior of skin: Secondary | ICD-10-CM | POA: Diagnosis not present

## 2015-10-22 DIAGNOSIS — Z23 Encounter for immunization: Secondary | ICD-10-CM | POA: Diagnosis not present

## 2015-10-22 DIAGNOSIS — D225 Melanocytic nevi of trunk: Secondary | ICD-10-CM | POA: Diagnosis not present

## 2015-10-22 DIAGNOSIS — D2272 Melanocytic nevi of left lower limb, including hip: Secondary | ICD-10-CM | POA: Diagnosis not present

## 2015-10-22 DIAGNOSIS — L814 Other melanin hyperpigmentation: Secondary | ICD-10-CM | POA: Diagnosis not present

## 2015-10-22 DIAGNOSIS — L821 Other seborrheic keratosis: Secondary | ICD-10-CM | POA: Diagnosis not present

## 2015-10-22 DIAGNOSIS — Z86018 Personal history of other benign neoplasm: Secondary | ICD-10-CM | POA: Diagnosis not present

## 2015-10-22 DIAGNOSIS — D1800 Hemangioma unspecified site: Secondary | ICD-10-CM | POA: Diagnosis not present

## 2015-12-03 DIAGNOSIS — D045 Carcinoma in situ of skin of trunk: Secondary | ICD-10-CM | POA: Diagnosis not present

## 2015-12-03 DIAGNOSIS — L814 Other melanin hyperpigmentation: Secondary | ICD-10-CM | POA: Diagnosis not present

## 2015-12-12 DIAGNOSIS — H26493 Other secondary cataract, bilateral: Secondary | ICD-10-CM | POA: Diagnosis not present

## 2016-02-18 DIAGNOSIS — H6981 Other specified disorders of Eustachian tube, right ear: Secondary | ICD-10-CM | POA: Diagnosis not present

## 2016-05-03 DIAGNOSIS — Z23 Encounter for immunization: Secondary | ICD-10-CM | POA: Diagnosis not present

## 2016-08-04 DIAGNOSIS — D485 Neoplasm of uncertain behavior of skin: Secondary | ICD-10-CM | POA: Diagnosis not present

## 2016-08-04 DIAGNOSIS — L820 Inflamed seborrheic keratosis: Secondary | ICD-10-CM | POA: Diagnosis not present

## 2016-09-08 DIAGNOSIS — H26493 Other secondary cataract, bilateral: Secondary | ICD-10-CM | POA: Diagnosis not present

## 2016-09-27 DIAGNOSIS — H26491 Other secondary cataract, right eye: Secondary | ICD-10-CM | POA: Diagnosis not present

## 2016-09-29 DIAGNOSIS — M79609 Pain in unspecified limb: Secondary | ICD-10-CM | POA: Diagnosis not present

## 2016-09-29 DIAGNOSIS — Z1211 Encounter for screening for malignant neoplasm of colon: Secondary | ICD-10-CM | POA: Diagnosis not present

## 2016-09-29 DIAGNOSIS — M25552 Pain in left hip: Secondary | ICD-10-CM | POA: Diagnosis not present

## 2016-09-29 DIAGNOSIS — Z0000 Encounter for general adult medical examination without abnormal findings: Secondary | ICD-10-CM | POA: Diagnosis not present

## 2016-09-29 DIAGNOSIS — R202 Paresthesia of skin: Secondary | ICD-10-CM | POA: Diagnosis not present

## 2016-10-06 DIAGNOSIS — Z1211 Encounter for screening for malignant neoplasm of colon: Secondary | ICD-10-CM | POA: Diagnosis not present

## 2016-10-11 DIAGNOSIS — M25552 Pain in left hip: Secondary | ICD-10-CM | POA: Diagnosis not present

## 2016-10-11 DIAGNOSIS — M545 Low back pain: Secondary | ICD-10-CM | POA: Diagnosis not present

## 2016-10-11 DIAGNOSIS — M5432 Sciatica, left side: Secondary | ICD-10-CM | POA: Diagnosis not present

## 2016-10-18 DIAGNOSIS — M5432 Sciatica, left side: Secondary | ICD-10-CM | POA: Diagnosis not present

## 2016-10-18 DIAGNOSIS — M545 Low back pain: Secondary | ICD-10-CM | POA: Diagnosis not present

## 2016-10-18 DIAGNOSIS — M25552 Pain in left hip: Secondary | ICD-10-CM | POA: Diagnosis not present

## 2016-10-25 DIAGNOSIS — L821 Other seborrheic keratosis: Secondary | ICD-10-CM | POA: Diagnosis not present

## 2016-10-25 DIAGNOSIS — L739 Follicular disorder, unspecified: Secondary | ICD-10-CM | POA: Diagnosis not present

## 2016-10-25 DIAGNOSIS — D485 Neoplasm of uncertain behavior of skin: Secondary | ICD-10-CM | POA: Diagnosis not present

## 2016-10-25 DIAGNOSIS — D1800 Hemangioma unspecified site: Secondary | ICD-10-CM | POA: Diagnosis not present

## 2016-10-25 DIAGNOSIS — D2272 Melanocytic nevi of left lower limb, including hip: Secondary | ICD-10-CM | POA: Diagnosis not present

## 2016-10-25 DIAGNOSIS — Z85828 Personal history of other malignant neoplasm of skin: Secondary | ICD-10-CM | POA: Diagnosis not present

## 2016-10-25 DIAGNOSIS — D225 Melanocytic nevi of trunk: Secondary | ICD-10-CM | POA: Diagnosis not present

## 2016-10-25 DIAGNOSIS — Z808 Family history of malignant neoplasm of other organs or systems: Secondary | ICD-10-CM | POA: Diagnosis not present

## 2016-10-25 DIAGNOSIS — Z23 Encounter for immunization: Secondary | ICD-10-CM | POA: Diagnosis not present

## 2016-10-25 DIAGNOSIS — L570 Actinic keratosis: Secondary | ICD-10-CM | POA: Diagnosis not present

## 2016-10-25 DIAGNOSIS — Z86018 Personal history of other benign neoplasm: Secondary | ICD-10-CM | POA: Diagnosis not present

## 2016-11-29 DIAGNOSIS — M25512 Pain in left shoulder: Secondary | ICD-10-CM | POA: Diagnosis not present

## 2016-11-29 DIAGNOSIS — M5136 Other intervertebral disc degeneration, lumbar region: Secondary | ICD-10-CM | POA: Diagnosis not present

## 2017-02-12 DIAGNOSIS — G8929 Other chronic pain: Secondary | ICD-10-CM | POA: Diagnosis not present

## 2017-02-12 DIAGNOSIS — M7542 Impingement syndrome of left shoulder: Secondary | ICD-10-CM | POA: Diagnosis not present

## 2017-02-12 DIAGNOSIS — M25512 Pain in left shoulder: Secondary | ICD-10-CM | POA: Diagnosis not present

## 2017-04-13 DIAGNOSIS — H26493 Other secondary cataract, bilateral: Secondary | ICD-10-CM | POA: Diagnosis not present

## 2017-05-23 DIAGNOSIS — Z23 Encounter for immunization: Secondary | ICD-10-CM | POA: Diagnosis not present

## 2017-05-24 DIAGNOSIS — G8929 Other chronic pain: Secondary | ICD-10-CM | POA: Diagnosis not present

## 2017-05-24 DIAGNOSIS — M25512 Pain in left shoulder: Secondary | ICD-10-CM | POA: Diagnosis not present

## 2017-05-24 DIAGNOSIS — M7542 Impingement syndrome of left shoulder: Secondary | ICD-10-CM | POA: Diagnosis not present

## 2017-05-31 DIAGNOSIS — M25512 Pain in left shoulder: Secondary | ICD-10-CM | POA: Diagnosis not present

## 2017-05-31 DIAGNOSIS — G8929 Other chronic pain: Secondary | ICD-10-CM | POA: Diagnosis not present

## 2017-06-03 DIAGNOSIS — M19012 Primary osteoarthritis, left shoulder: Secondary | ICD-10-CM | POA: Diagnosis not present

## 2017-06-03 DIAGNOSIS — S43432A Superior glenoid labrum lesion of left shoulder, initial encounter: Secondary | ICD-10-CM | POA: Diagnosis not present

## 2017-06-03 DIAGNOSIS — M75112 Incomplete rotator cuff tear or rupture of left shoulder, not specified as traumatic: Secondary | ICD-10-CM | POA: Diagnosis not present

## 2017-07-03 DIAGNOSIS — M542 Cervicalgia: Secondary | ICD-10-CM | POA: Diagnosis not present

## 2017-07-04 DIAGNOSIS — M542 Cervicalgia: Secondary | ICD-10-CM | POA: Diagnosis not present

## 2017-07-05 DIAGNOSIS — X58XXXA Exposure to other specified factors, initial encounter: Secondary | ICD-10-CM | POA: Diagnosis not present

## 2017-07-05 DIAGNOSIS — M7522 Bicipital tendinitis, left shoulder: Secondary | ICD-10-CM | POA: Diagnosis not present

## 2017-07-05 DIAGNOSIS — Y999 Unspecified external cause status: Secondary | ICD-10-CM | POA: Diagnosis not present

## 2017-07-05 DIAGNOSIS — M7542 Impingement syndrome of left shoulder: Secondary | ICD-10-CM | POA: Diagnosis not present

## 2017-07-05 DIAGNOSIS — M75112 Incomplete rotator cuff tear or rupture of left shoulder, not specified as traumatic: Secondary | ICD-10-CM | POA: Diagnosis not present

## 2017-07-05 DIAGNOSIS — M19012 Primary osteoarthritis, left shoulder: Secondary | ICD-10-CM | POA: Diagnosis not present

## 2017-07-05 DIAGNOSIS — G8918 Other acute postprocedural pain: Secondary | ICD-10-CM | POA: Diagnosis not present

## 2017-07-05 DIAGNOSIS — S46192A Other injury of muscle, fascia and tendon of long head of biceps, left arm, initial encounter: Secondary | ICD-10-CM | POA: Diagnosis not present

## 2017-07-13 DIAGNOSIS — M7542 Impingement syndrome of left shoulder: Secondary | ICD-10-CM | POA: Diagnosis not present

## 2017-07-13 DIAGNOSIS — S43432D Superior glenoid labrum lesion of left shoulder, subsequent encounter: Secondary | ICD-10-CM | POA: Diagnosis not present

## 2017-07-13 DIAGNOSIS — Z4789 Encounter for other orthopedic aftercare: Secondary | ICD-10-CM | POA: Diagnosis not present

## 2017-07-19 DIAGNOSIS — M25512 Pain in left shoulder: Secondary | ICD-10-CM | POA: Diagnosis not present

## 2017-07-22 DIAGNOSIS — M25512 Pain in left shoulder: Secondary | ICD-10-CM | POA: Diagnosis not present

## 2017-07-27 DIAGNOSIS — M25512 Pain in left shoulder: Secondary | ICD-10-CM | POA: Diagnosis not present

## 2017-07-29 DIAGNOSIS — M25512 Pain in left shoulder: Secondary | ICD-10-CM | POA: Diagnosis not present

## 2017-08-03 DIAGNOSIS — M25512 Pain in left shoulder: Secondary | ICD-10-CM | POA: Diagnosis not present

## 2017-08-08 DIAGNOSIS — M25512 Pain in left shoulder: Secondary | ICD-10-CM | POA: Diagnosis not present

## 2017-10-03 DIAGNOSIS — Z87891 Personal history of nicotine dependence: Secondary | ICD-10-CM | POA: Diagnosis not present

## 2017-10-03 DIAGNOSIS — R202 Paresthesia of skin: Secondary | ICD-10-CM | POA: Diagnosis not present

## 2017-10-03 DIAGNOSIS — Z23 Encounter for immunization: Secondary | ICD-10-CM | POA: Diagnosis not present

## 2017-10-03 DIAGNOSIS — Z1211 Encounter for screening for malignant neoplasm of colon: Secondary | ICD-10-CM | POA: Diagnosis not present

## 2017-10-03 DIAGNOSIS — Z13220 Encounter for screening for lipoid disorders: Secondary | ICD-10-CM | POA: Diagnosis not present

## 2017-10-03 DIAGNOSIS — R200 Anesthesia of skin: Secondary | ICD-10-CM | POA: Diagnosis not present

## 2017-10-03 DIAGNOSIS — Z0000 Encounter for general adult medical examination without abnormal findings: Secondary | ICD-10-CM | POA: Diagnosis not present

## 2017-10-03 DIAGNOSIS — Z130 Encounter for screening for diseases of the blood and blood-forming organs and certain disorders involving the immune mechanism: Secondary | ICD-10-CM | POA: Diagnosis not present

## 2017-10-03 DIAGNOSIS — Z136 Encounter for screening for cardiovascular disorders: Secondary | ICD-10-CM | POA: Diagnosis not present

## 2017-10-03 DIAGNOSIS — Z125 Encounter for screening for malignant neoplasm of prostate: Secondary | ICD-10-CM | POA: Diagnosis not present

## 2017-10-05 ENCOUNTER — Other Ambulatory Visit: Payer: Self-pay | Admitting: Family Medicine

## 2017-10-05 DIAGNOSIS — Z87891 Personal history of nicotine dependence: Secondary | ICD-10-CM

## 2017-10-05 DIAGNOSIS — Z136 Encounter for screening for cardiovascular disorders: Secondary | ICD-10-CM

## 2017-10-14 ENCOUNTER — Ambulatory Visit
Admission: RE | Admit: 2017-10-14 | Discharge: 2017-10-14 | Disposition: A | Discharge status: Home / Self Care | Payer: Medicare Other | Source: Ambulatory Visit | Attending: Family Medicine | Admitting: Family Medicine

## 2017-10-14 DIAGNOSIS — Z87891 Personal history of nicotine dependence: Secondary | ICD-10-CM | POA: Diagnosis not present

## 2017-10-14 DIAGNOSIS — I714 Abdominal aortic aneurysm, without rupture: Secondary | ICD-10-CM | POA: Diagnosis not present

## 2017-10-14 DIAGNOSIS — Z136 Encounter for screening for cardiovascular disorders: Secondary | ICD-10-CM

## 2017-10-14 IMAGING — US US ABDOMINAL AORTA SCREENING AAA
1 series · 14 of 19 positions shown · non-contrast
Comparison: None.

CLINICAL DATA: Screening for abdominal aortic aneurysm

EXAM:
ULTRASOUND OF ABDOMINAL AORTA
TECHNIQUE: Ultrasound examination of the abdominal aorta was performed to
evaluate for abdominal aortic aneurysm.

[Series 1: us abdominal aorta screening aaa · 0.23mm/px · 14 of 19 slices shown]
[im 1/19]
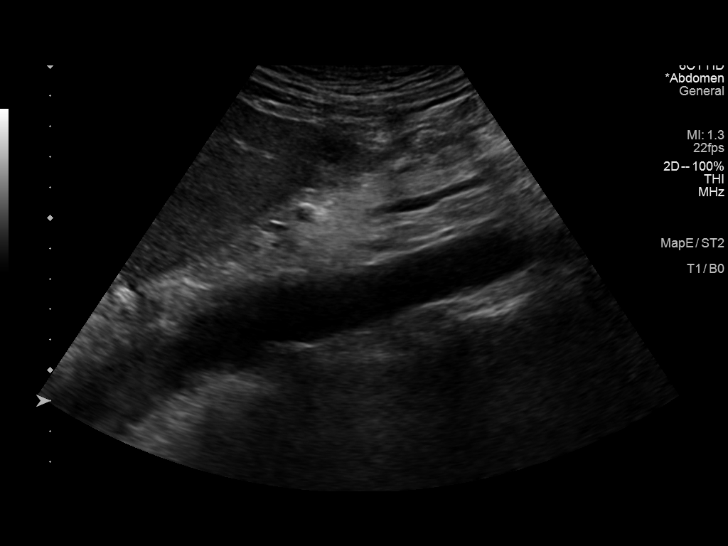
[im 3/19]
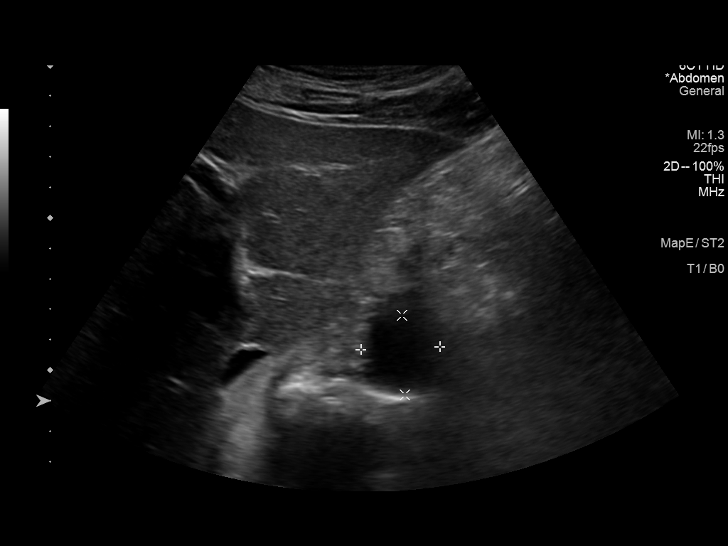
[im 4/19]
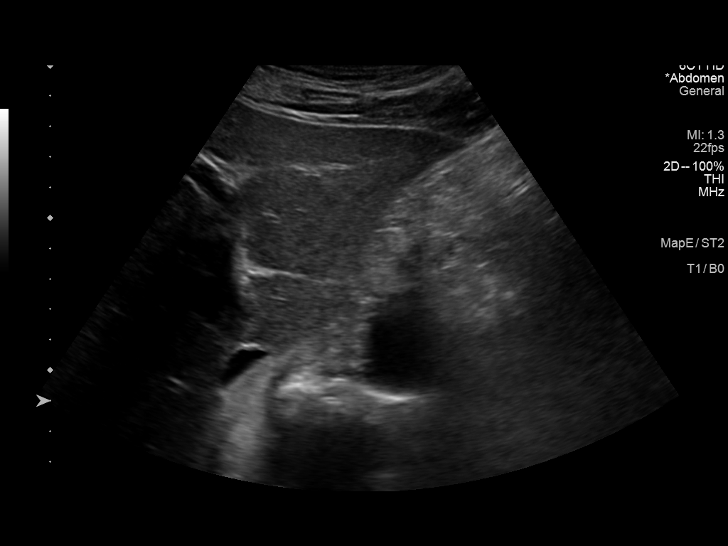
[im 5/19]
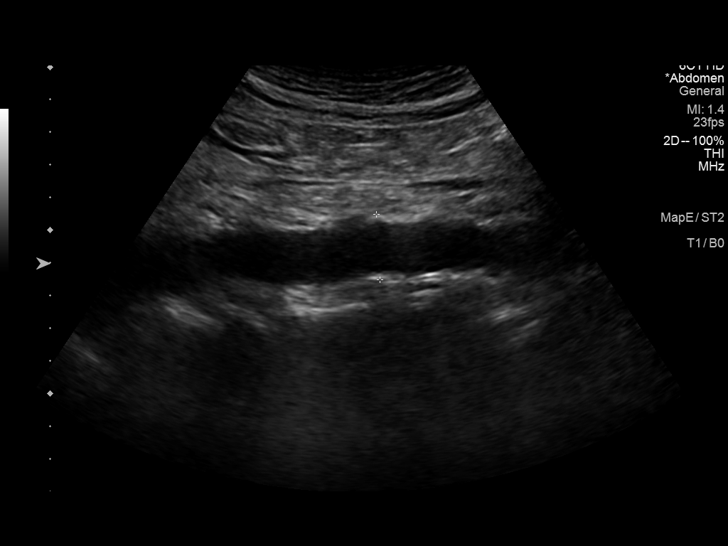
[im 7/19]
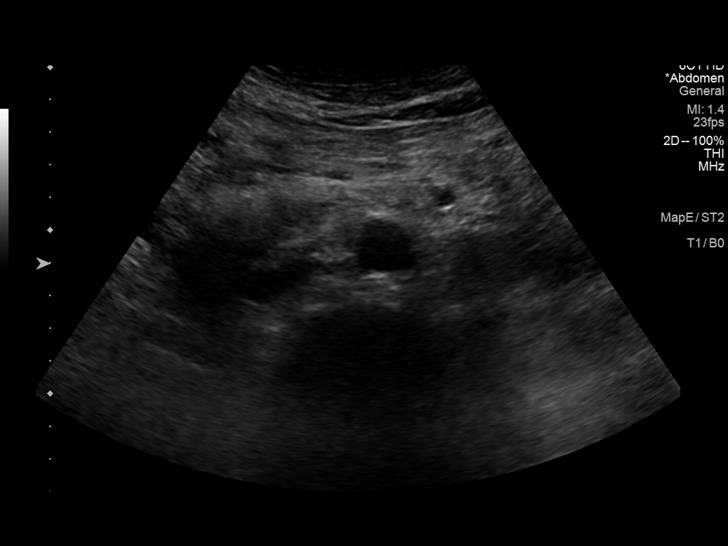
[im 8/19]
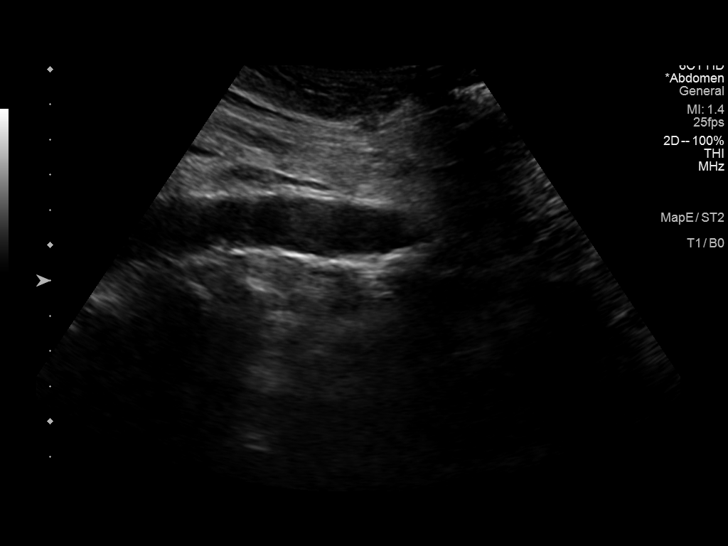
[im 9/19]
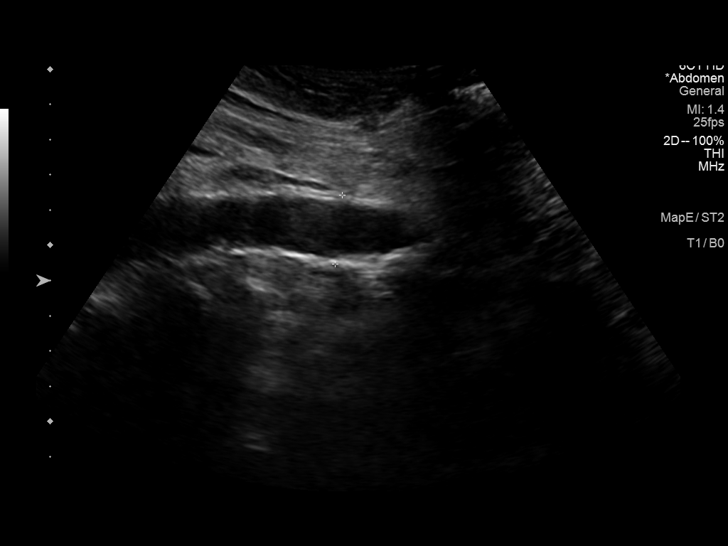
[im 11/19]
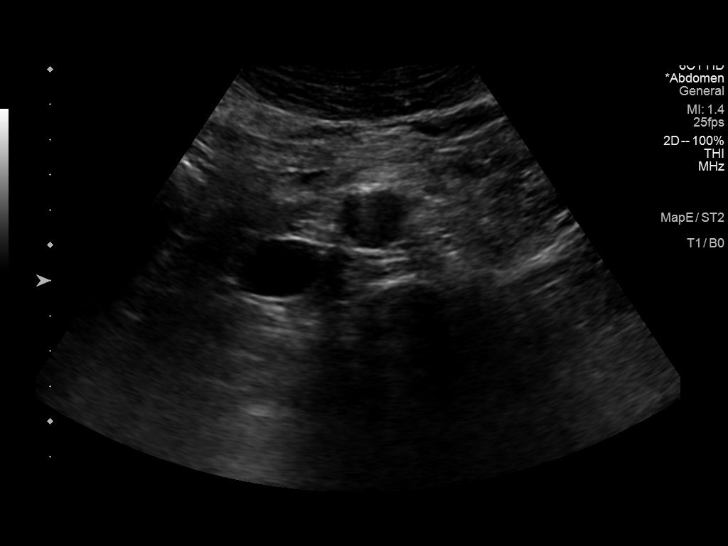
[im 12/19]
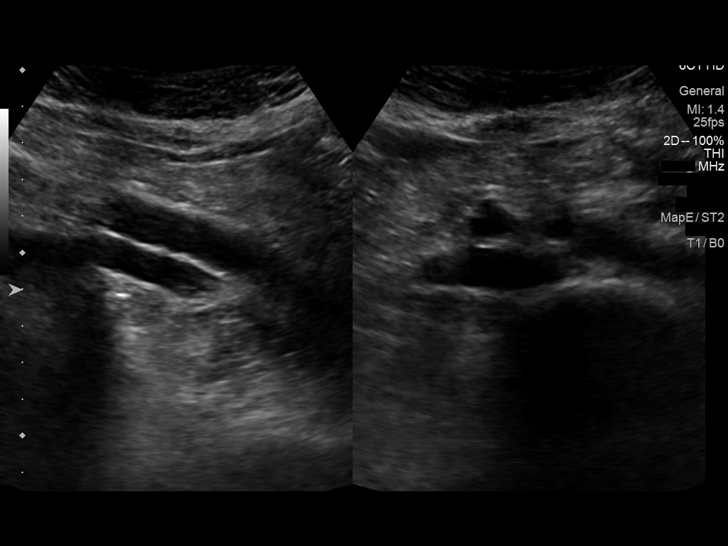
[im 13/19]
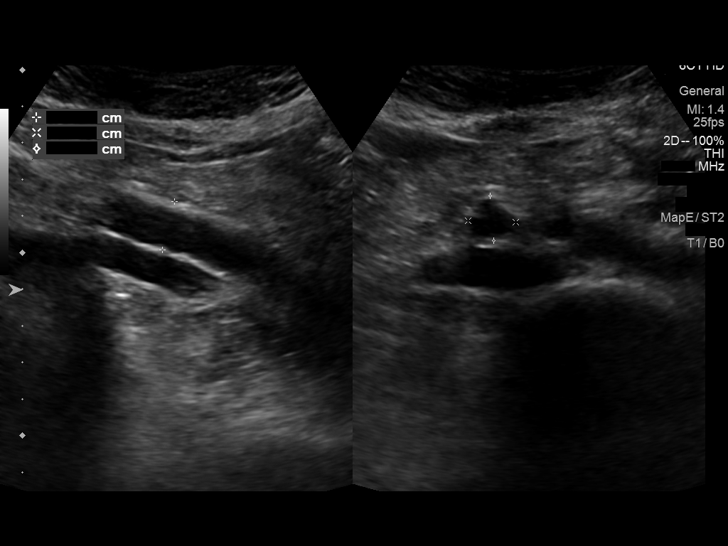
[im 15/19]
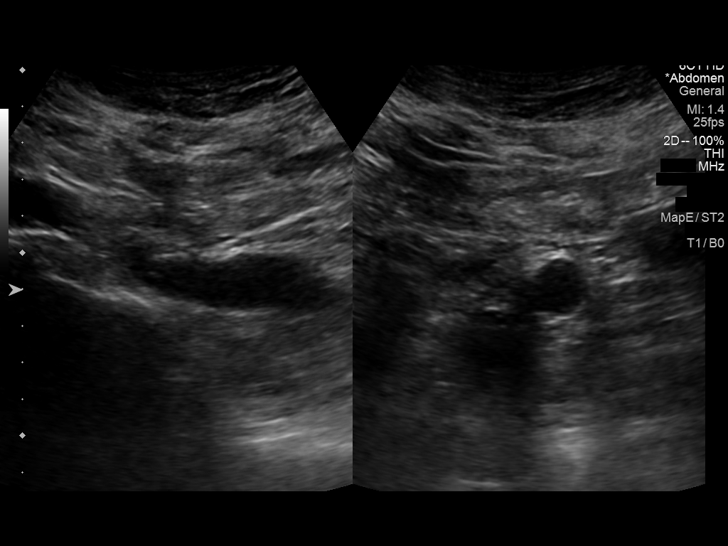
[im 16/19]
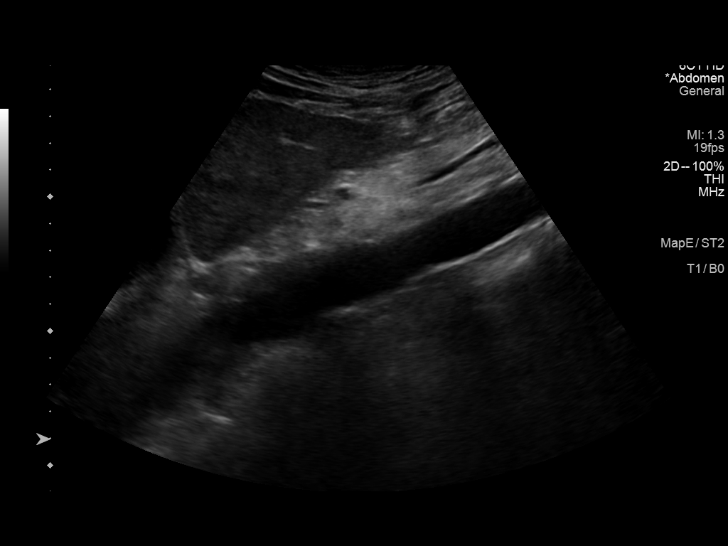
[im 17/19]
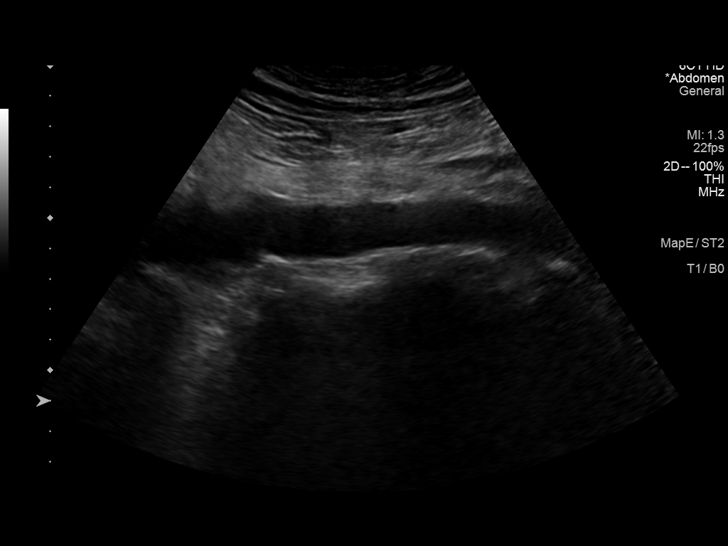
[im 19/19]
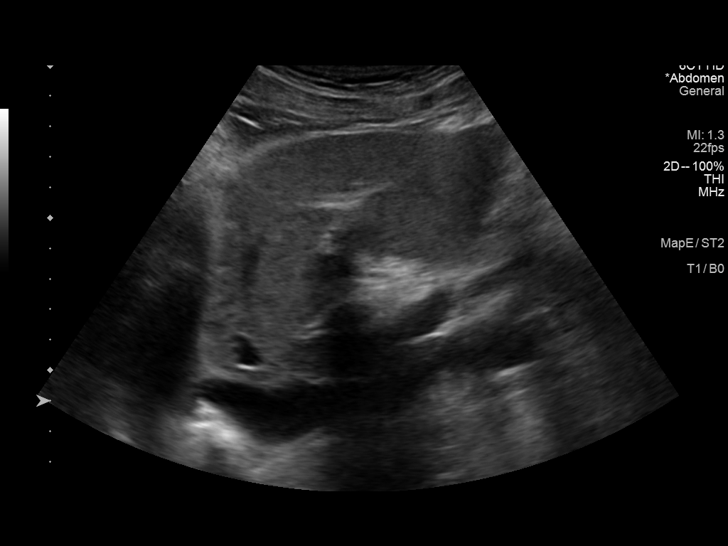

[14 of 19 positions shown; findings below may reference images not displayed]

FINDINGS: Abdominal aortic measurements as follows:

Proximal:  2.6 cm

Mid:  2.0 cm

Distal:  2.0 cm
IMPRESSION: Negative for abdominal aortic aneurysm

## 2017-10-26 DIAGNOSIS — H26492 Other secondary cataract, left eye: Secondary | ICD-10-CM | POA: Diagnosis not present

## 2017-11-02 DIAGNOSIS — Z86018 Personal history of other benign neoplasm: Secondary | ICD-10-CM | POA: Diagnosis not present

## 2017-11-02 DIAGNOSIS — L570 Actinic keratosis: Secondary | ICD-10-CM | POA: Diagnosis not present

## 2017-11-02 DIAGNOSIS — B078 Other viral warts: Secondary | ICD-10-CM | POA: Diagnosis not present

## 2017-11-02 DIAGNOSIS — L821 Other seborrheic keratosis: Secondary | ICD-10-CM | POA: Diagnosis not present

## 2017-11-02 DIAGNOSIS — D225 Melanocytic nevi of trunk: Secondary | ICD-10-CM | POA: Diagnosis not present

## 2017-11-02 DIAGNOSIS — Z85828 Personal history of other malignant neoplasm of skin: Secondary | ICD-10-CM | POA: Diagnosis not present

## 2017-11-02 DIAGNOSIS — D1800 Hemangioma unspecified site: Secondary | ICD-10-CM | POA: Diagnosis not present

## 2017-11-02 DIAGNOSIS — D2272 Melanocytic nevi of left lower limb, including hip: Secondary | ICD-10-CM | POA: Diagnosis not present

## 2017-11-02 DIAGNOSIS — Z808 Family history of malignant neoplasm of other organs or systems: Secondary | ICD-10-CM | POA: Diagnosis not present

## 2018-04-24 DIAGNOSIS — Z23 Encounter for immunization: Secondary | ICD-10-CM | POA: Diagnosis not present

## 2018-06-13 DIAGNOSIS — M546 Pain in thoracic spine: Secondary | ICD-10-CM | POA: Diagnosis not present

## 2018-06-13 DIAGNOSIS — M62830 Muscle spasm of back: Secondary | ICD-10-CM | POA: Diagnosis not present

## 2018-06-13 DIAGNOSIS — M542 Cervicalgia: Secondary | ICD-10-CM | POA: Diagnosis not present

## 2018-06-13 DIAGNOSIS — M5030 Other cervical disc degeneration, unspecified cervical region: Secondary | ICD-10-CM | POA: Diagnosis not present

## 2018-07-07 DIAGNOSIS — M542 Cervicalgia: Secondary | ICD-10-CM | POA: Diagnosis not present

## 2018-07-07 DIAGNOSIS — M5030 Other cervical disc degeneration, unspecified cervical region: Secondary | ICD-10-CM | POA: Diagnosis not present

## 2018-08-30 DIAGNOSIS — H26492 Other secondary cataract, left eye: Secondary | ICD-10-CM | POA: Diagnosis not present

## 2018-09-04 DIAGNOSIS — H26492 Other secondary cataract, left eye: Secondary | ICD-10-CM | POA: Diagnosis not present

## 2018-09-06 DIAGNOSIS — H6591 Unspecified nonsuppurative otitis media, right ear: Secondary | ICD-10-CM | POA: Diagnosis not present

## 2018-09-06 DIAGNOSIS — H9201 Otalgia, right ear: Secondary | ICD-10-CM | POA: Diagnosis not present

## 2018-09-27 DIAGNOSIS — R079 Chest pain, unspecified: Secondary | ICD-10-CM | POA: Diagnosis not present

## 2018-10-11 DIAGNOSIS — Z125 Encounter for screening for malignant neoplasm of prostate: Secondary | ICD-10-CM | POA: Diagnosis not present

## 2018-10-11 DIAGNOSIS — R030 Elevated blood-pressure reading, without diagnosis of hypertension: Secondary | ICD-10-CM | POA: Diagnosis not present

## 2018-10-11 DIAGNOSIS — Z13220 Encounter for screening for lipoid disorders: Secondary | ICD-10-CM | POA: Diagnosis not present

## 2018-10-11 DIAGNOSIS — E789 Disorder of lipoprotein metabolism, unspecified: Secondary | ICD-10-CM | POA: Diagnosis not present

## 2018-10-11 DIAGNOSIS — Z0000 Encounter for general adult medical examination without abnormal findings: Secondary | ICD-10-CM | POA: Diagnosis not present

## 2018-10-11 DIAGNOSIS — Z1211 Encounter for screening for malignant neoplasm of colon: Secondary | ICD-10-CM | POA: Diagnosis not present

## 2018-11-29 DIAGNOSIS — L570 Actinic keratosis: Secondary | ICD-10-CM | POA: Diagnosis not present

## 2018-12-26 DIAGNOSIS — M25551 Pain in right hip: Secondary | ICD-10-CM | POA: Diagnosis not present

## 2019-01-17 DIAGNOSIS — L570 Actinic keratosis: Secondary | ICD-10-CM | POA: Diagnosis not present

## 2019-01-17 DIAGNOSIS — L719 Rosacea, unspecified: Secondary | ICD-10-CM | POA: Diagnosis not present

## 2019-01-25 DIAGNOSIS — M1611 Unilateral primary osteoarthritis, right hip: Secondary | ICD-10-CM | POA: Diagnosis not present

## 2019-01-29 DIAGNOSIS — G4730 Sleep apnea, unspecified: Secondary | ICD-10-CM | POA: Diagnosis not present

## 2019-02-20 DIAGNOSIS — H524 Presbyopia: Secondary | ICD-10-CM | POA: Diagnosis not present

## 2019-02-20 DIAGNOSIS — H10413 Chronic giant papillary conjunctivitis, bilateral: Secondary | ICD-10-CM | POA: Diagnosis not present

## 2019-02-20 DIAGNOSIS — Z961 Presence of intraocular lens: Secondary | ICD-10-CM | POA: Diagnosis not present

## 2019-02-20 DIAGNOSIS — H52203 Unspecified astigmatism, bilateral: Secondary | ICD-10-CM | POA: Diagnosis not present

## 2019-03-19 DIAGNOSIS — R682 Dry mouth, unspecified: Secondary | ICD-10-CM | POA: Diagnosis not present

## 2019-03-19 DIAGNOSIS — R0681 Apnea, not elsewhere classified: Secondary | ICD-10-CM | POA: Diagnosis not present

## 2019-04-10 DIAGNOSIS — G4733 Obstructive sleep apnea (adult) (pediatric): Secondary | ICD-10-CM | POA: Diagnosis not present

## 2019-04-11 DIAGNOSIS — G4733 Obstructive sleep apnea (adult) (pediatric): Secondary | ICD-10-CM | POA: Diagnosis not present

## 2019-05-21 DIAGNOSIS — Z23 Encounter for immunization: Secondary | ICD-10-CM | POA: Diagnosis not present

## 2019-05-21 DIAGNOSIS — R413 Other amnesia: Secondary | ICD-10-CM | POA: Diagnosis not present

## 2019-06-06 DIAGNOSIS — M65331 Trigger finger, right middle finger: Secondary | ICD-10-CM | POA: Diagnosis not present

## 2019-06-06 DIAGNOSIS — M79644 Pain in right finger(s): Secondary | ICD-10-CM | POA: Diagnosis not present

## 2019-06-06 DIAGNOSIS — M65332 Trigger finger, left middle finger: Secondary | ICD-10-CM

## 2019-06-06 DIAGNOSIS — M79645 Pain in left finger(s): Secondary | ICD-10-CM | POA: Diagnosis not present

## 2019-06-06 HISTORY — DX: Trigger finger, right middle finger: M65.331

## 2019-06-06 HISTORY — DX: Trigger finger, left middle finger: M65.332

## 2019-07-02 DIAGNOSIS — M65331 Trigger finger, right middle finger: Secondary | ICD-10-CM | POA: Diagnosis not present

## 2019-07-02 DIAGNOSIS — M65332 Trigger finger, left middle finger: Secondary | ICD-10-CM | POA: Diagnosis not present

## 2019-07-02 DIAGNOSIS — M79644 Pain in right finger(s): Secondary | ICD-10-CM | POA: Diagnosis not present

## 2019-07-02 DIAGNOSIS — M79645 Pain in left finger(s): Secondary | ICD-10-CM | POA: Diagnosis not present

## 2019-07-16 DIAGNOSIS — G4733 Obstructive sleep apnea (adult) (pediatric): Secondary | ICD-10-CM | POA: Diagnosis not present

## 2019-08-10 DIAGNOSIS — M1611 Unilateral primary osteoarthritis, right hip: Secondary | ICD-10-CM | POA: Diagnosis not present

## 2019-08-23 DIAGNOSIS — M65332 Trigger finger, left middle finger: Secondary | ICD-10-CM | POA: Diagnosis not present

## 2019-08-23 DIAGNOSIS — M65331 Trigger finger, right middle finger: Secondary | ICD-10-CM | POA: Diagnosis not present

## 2020-04-14 DIAGNOSIS — Z131 Encounter for screening for diabetes mellitus: Secondary | ICD-10-CM | POA: Diagnosis not present

## 2020-04-14 DIAGNOSIS — Z125 Encounter for screening for malignant neoplasm of prostate: Secondary | ICD-10-CM | POA: Diagnosis not present

## 2020-04-14 DIAGNOSIS — Z0000 Encounter for general adult medical examination without abnormal findings: Secondary | ICD-10-CM | POA: Diagnosis not present

## 2020-05-14 DIAGNOSIS — Z23 Encounter for immunization: Secondary | ICD-10-CM | POA: Diagnosis not present

## 2020-05-26 DIAGNOSIS — Z23 Encounter for immunization: Secondary | ICD-10-CM | POA: Diagnosis not present

## 2020-05-29 DIAGNOSIS — H5203 Hypermetropia, bilateral: Secondary | ICD-10-CM | POA: Diagnosis not present

## 2020-05-29 DIAGNOSIS — H524 Presbyopia: Secondary | ICD-10-CM | POA: Diagnosis not present

## 2020-05-29 DIAGNOSIS — H52203 Unspecified astigmatism, bilateral: Secondary | ICD-10-CM | POA: Diagnosis not present

## 2020-05-29 DIAGNOSIS — H10413 Chronic giant papillary conjunctivitis, bilateral: Secondary | ICD-10-CM | POA: Diagnosis not present

## 2020-06-04 DIAGNOSIS — M25561 Pain in right knee: Secondary | ICD-10-CM | POA: Diagnosis not present

## 2020-07-01 DIAGNOSIS — G629 Polyneuropathy, unspecified: Secondary | ICD-10-CM | POA: Diagnosis not present

## 2020-07-04 ENCOUNTER — Other Ambulatory Visit: Payer: Self-pay

## 2020-07-04 ENCOUNTER — Ambulatory Visit
Admission: RE | Admit: 2020-07-04 | Discharge: 2020-07-04 | Disposition: A | Discharge status: Home / Self Care | Payer: Medicare Other | Source: Ambulatory Visit | Attending: Family Medicine | Admitting: Family Medicine

## 2020-07-04 ENCOUNTER — Other Ambulatory Visit: Payer: Self-pay | Admitting: Family Medicine

## 2020-07-04 DIAGNOSIS — M5136 Other intervertebral disc degeneration, lumbar region: Secondary | ICD-10-CM | POA: Diagnosis not present

## 2020-07-04 DIAGNOSIS — R202 Paresthesia of skin: Secondary | ICD-10-CM

## 2020-07-04 DIAGNOSIS — R200 Anesthesia of skin: Secondary | ICD-10-CM | POA: Diagnosis not present

## 2020-07-04 DIAGNOSIS — S22089A Unspecified fracture of T11-T12 vertebra, initial encounter for closed fracture: Secondary | ICD-10-CM | POA: Diagnosis not present

## 2020-07-04 IMAGING — CR DG LUMBAR SPINE COMPLETE 4+V
5 series · 5 of 5 positions shown · non-contrast
Comparison: None.

CLINICAL DATA: Bilateral lower extremity numbness and tingling

EXAM:
LUMBAR SPINE - COMPLETE 4+ VIEW

[t lumbar spine ap]
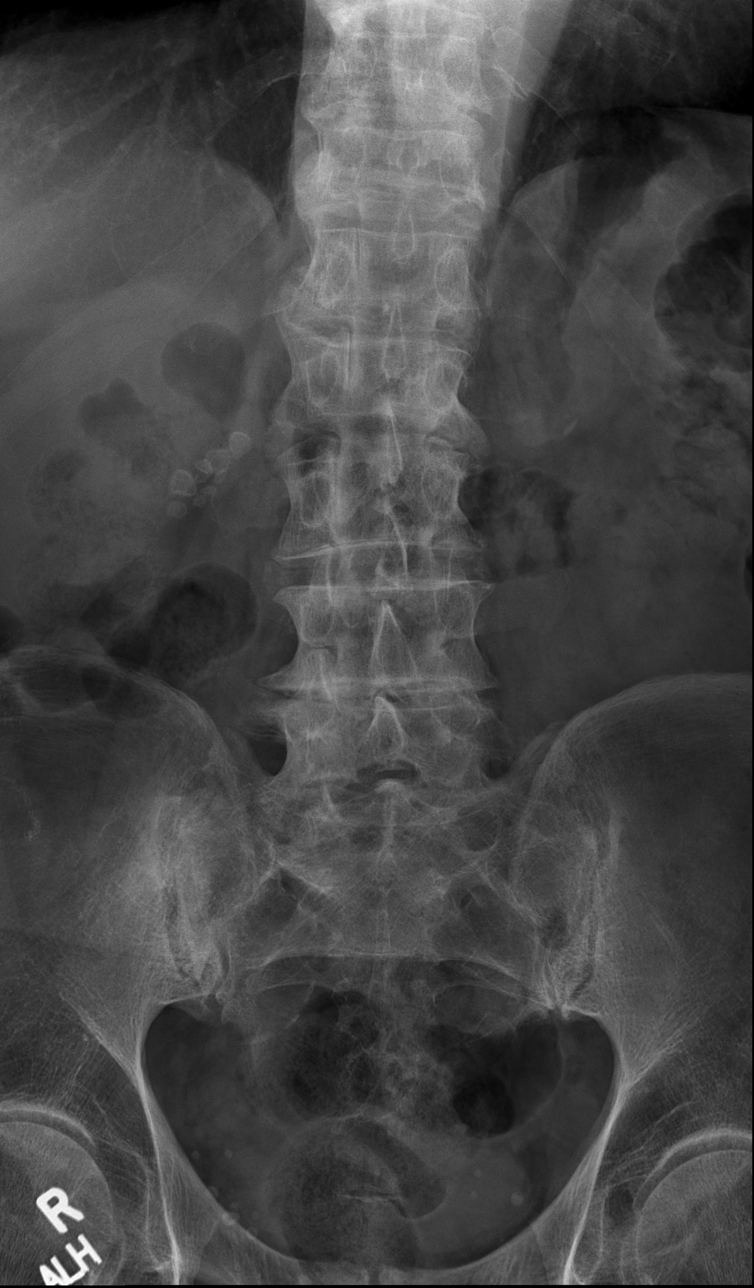

[t lumbar spine obl (1 of 2)]
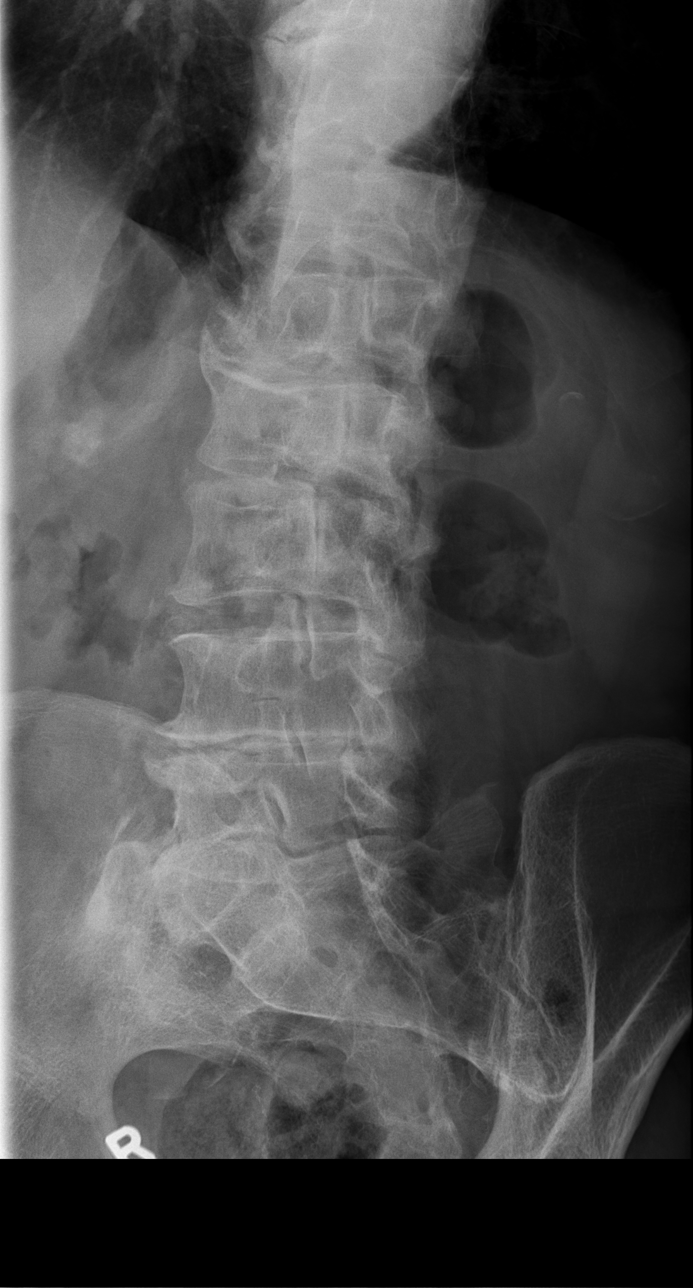

[t lumbar spine obl (2 of 2)]
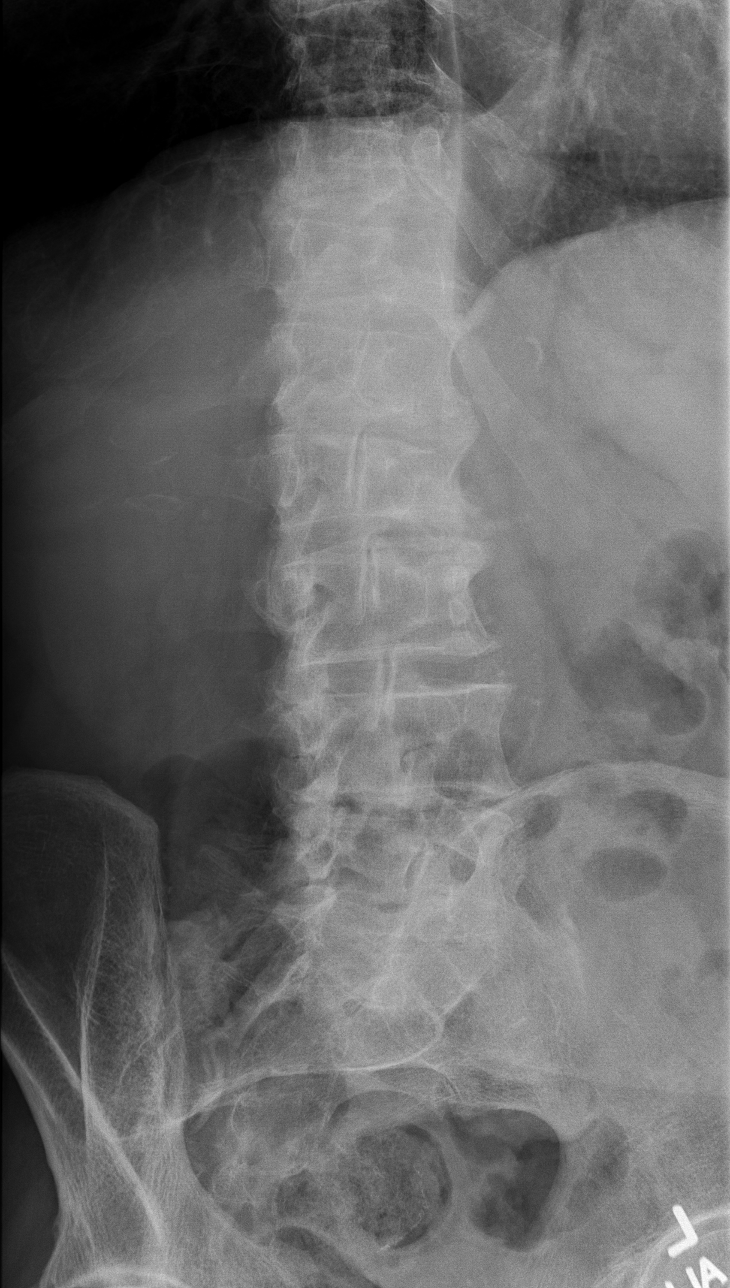

[t lumbar spine lat]
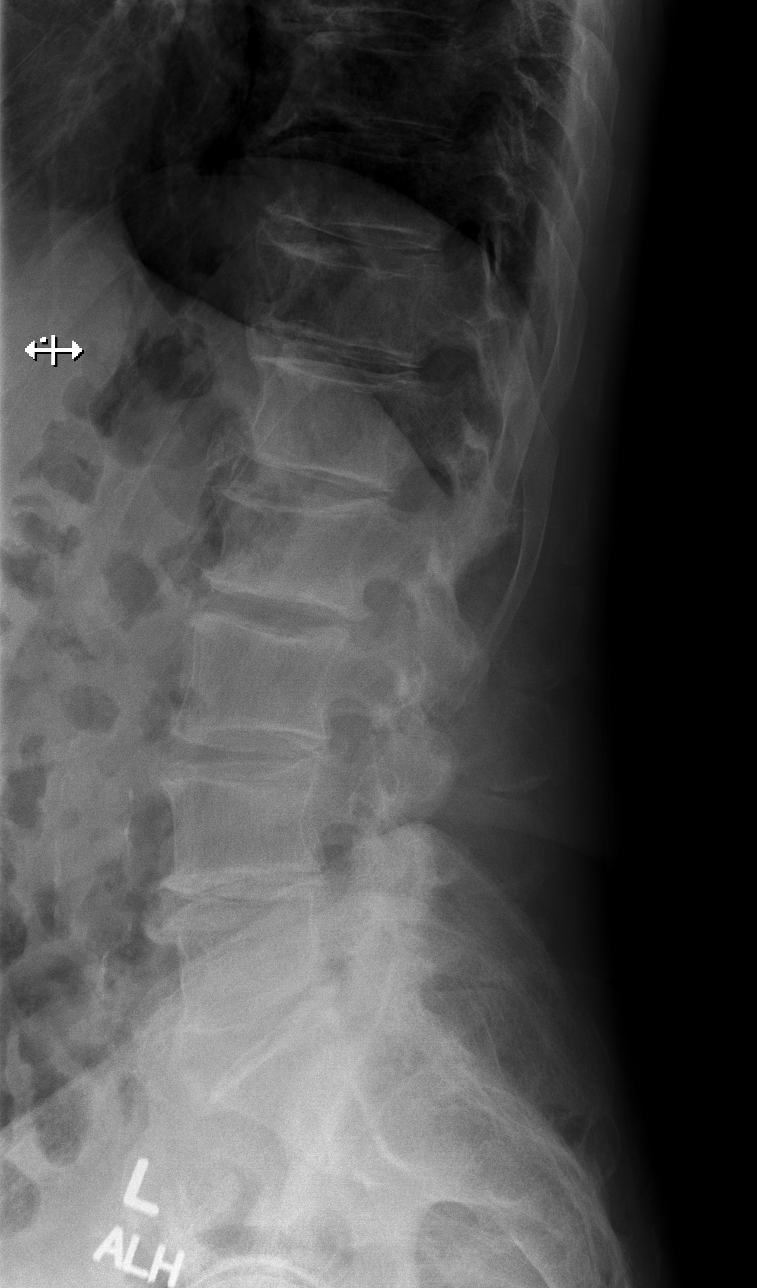

[t lumbar l-5 s-1 spot]
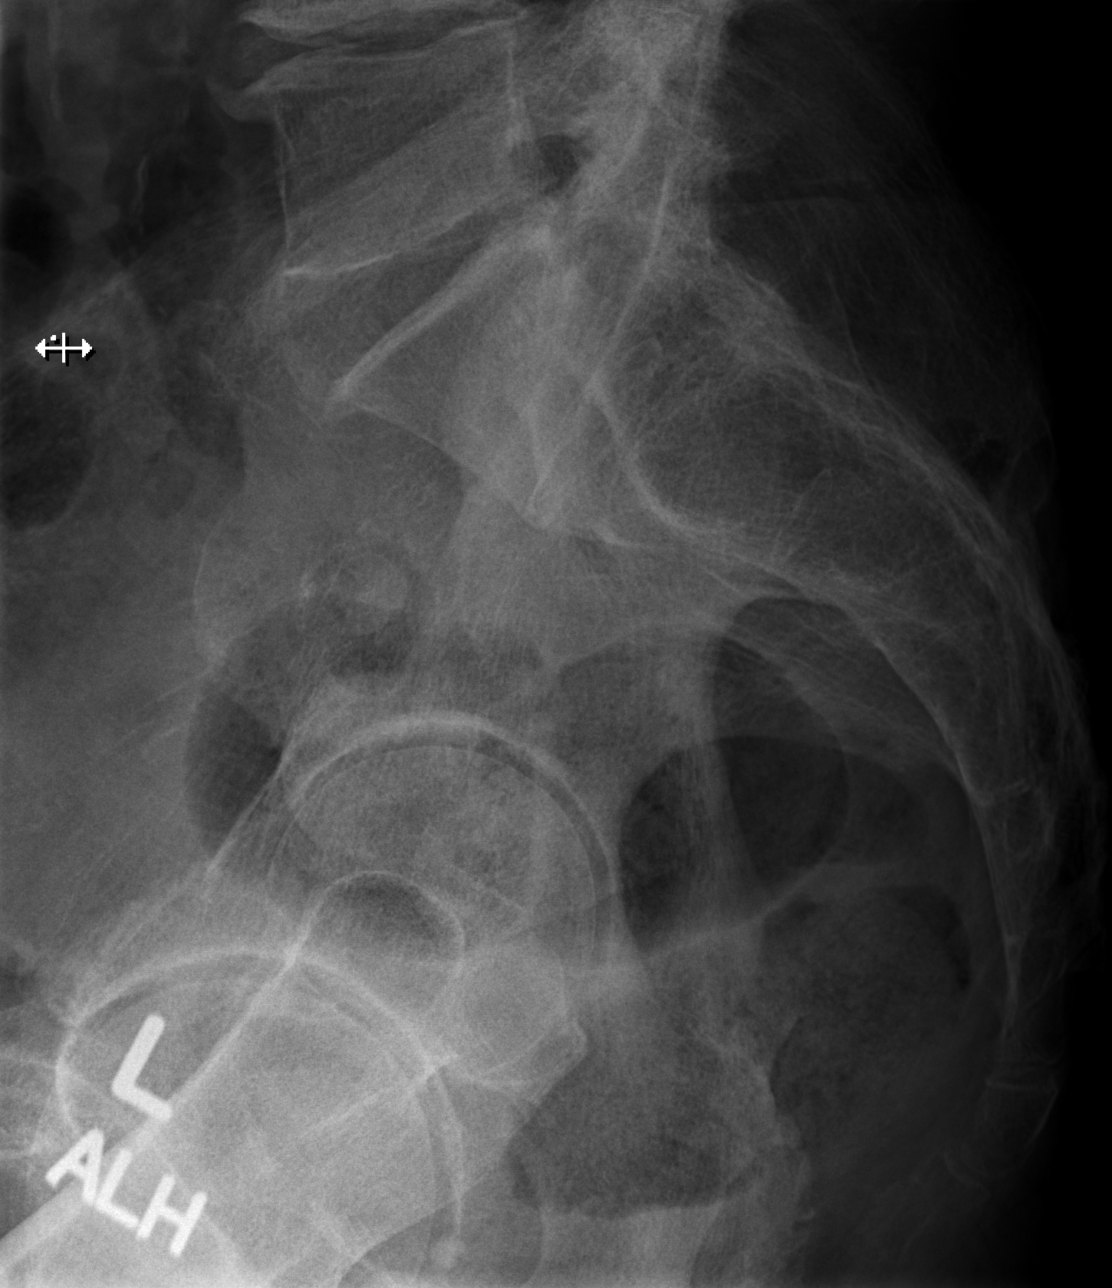

[5 of 5 positions shown; findings below may reference images not displayed]

FINDINGS: Five non rib bearing segments of the lumbar spine. Normal lumbar
lordosis. There is a superior endplate fracture of L2 with
approximately 10-20% loss of height, likely chronic in nature. No
definite acute fracture of the lumbar spine. No listhesis. There is
diffuse intervertebral disc space narrowing and endplate remodeling
throughout the lumbar spine, most severe at L4-5, in keeping with
changes of mild to moderate degenerative disc disease. Oblique views
demonstrate no evidence of pars defect.Multiple calcified probable
gallstones are seen within the right paraspinal region. The
paraspinal soft tissues are otherwise unremarkable.
IMPRESSION: Mild to moderate diffuse degenerative disc disease, most severe at
L4-5.

Remote superior endplate fracture T12.

Cholelithiasis

## 2020-08-15 DIAGNOSIS — M25561 Pain in right knee: Secondary | ICD-10-CM | POA: Diagnosis not present

## 2020-08-15 DIAGNOSIS — M1711 Unilateral primary osteoarthritis, right knee: Secondary | ICD-10-CM | POA: Diagnosis not present

## 2020-09-19 DIAGNOSIS — G4733 Obstructive sleep apnea (adult) (pediatric): Secondary | ICD-10-CM | POA: Diagnosis not present

## 2020-09-19 DIAGNOSIS — R413 Other amnesia: Secondary | ICD-10-CM | POA: Diagnosis not present

## 2020-09-19 DIAGNOSIS — Z87898 Personal history of other specified conditions: Secondary | ICD-10-CM | POA: Diagnosis not present

## 2020-09-22 ENCOUNTER — Encounter: Payer: Self-pay | Admitting: Counselor

## 2020-11-03 DIAGNOSIS — H353131 Nonexudative age-related macular degeneration, bilateral, early dry stage: Secondary | ICD-10-CM | POA: Diagnosis not present

## 2020-11-05 DIAGNOSIS — M170 Bilateral primary osteoarthritis of knee: Secondary | ICD-10-CM | POA: Diagnosis not present

## 2020-11-12 DIAGNOSIS — M62838 Other muscle spasm: Secondary | ICD-10-CM | POA: Diagnosis not present

## 2020-11-12 DIAGNOSIS — R059 Cough, unspecified: Secondary | ICD-10-CM | POA: Diagnosis not present

## 2020-11-12 DIAGNOSIS — J40 Bronchitis, not specified as acute or chronic: Secondary | ICD-10-CM | POA: Diagnosis not present

## 2020-11-12 DIAGNOSIS — R0981 Nasal congestion: Secondary | ICD-10-CM | POA: Diagnosis not present

## 2020-11-12 DIAGNOSIS — J019 Acute sinusitis, unspecified: Secondary | ICD-10-CM | POA: Diagnosis not present

## 2020-11-12 DIAGNOSIS — Z03818 Encounter for observation for suspected exposure to other biological agents ruled out: Secondary | ICD-10-CM | POA: Diagnosis not present

## 2020-11-12 DIAGNOSIS — M542 Cervicalgia: Secondary | ICD-10-CM | POA: Diagnosis not present

## 2020-11-24 ENCOUNTER — Encounter: Payer: Self-pay | Admitting: Counselor

## 2020-11-24 ENCOUNTER — Ambulatory Visit: Payer: Medicare Other

## 2020-11-24 ENCOUNTER — Other Ambulatory Visit: Payer: Self-pay

## 2020-11-24 ENCOUNTER — Ambulatory Visit (INDEPENDENT_AMBULATORY_CARE_PROVIDER_SITE_OTHER): Payer: Medicare Other | Admitting: Counselor

## 2020-11-24 DIAGNOSIS — F09 Unspecified mental disorder due to known physiological condition: Secondary | ICD-10-CM

## 2020-11-24 DIAGNOSIS — R413 Other amnesia: Secondary | ICD-10-CM
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
## 2020-11-24 NOTE — Progress Notes (Signed)
   Psychometrist Note   Cognitive testing was administered to Ronnie Gonzalez by Lamar Benes, B.S. (Technician) under the supervision of Alphonzo Severance, Psy.D., ABN. Ronnie Gonzalez was able to tolerate all test procedures. Dr. Nicole Kindred met with the patient as needed to manage any emotional reactions to the testing procedures. Rest breaks were offered.    The battery of tests administered was selected by Dr. Nicole Kindred with consideration to the patient's current level of functioning, the nature of his symptoms, emotional and behavioral responses during the interview, level of literacy, observed level of motivation/effort, and the nature of the referral question. This battery was communicated to the psychometrist. Communication between Dr. Nicole Kindred and the psychometrist was ongoing throughout the evaluation and Dr. Nicole Kindred was immediately accessible at all times. Dr. Nicole Kindred provided supervision to the technician on the date of this service, to the extent necessary to assure the quality of all services provided.    Ronnie Gonzalez will return in approximately one week for an interactive feedback session with Dr. Nicole Kindred, at which time test performance, clinical impressions, and treatment recommendations will be reviewed in detail. The patient understands he can contact our office should he require our assistance before this time.   A total of 95 minutes of billable time were spent with Ronnie Gonzalez by the technician, including test administration and scoring time. Billing for these services is reflected in Dr. Les Pou note.   This note reflects time spent with the psychometrician and does not include test scores, clinical history, or any interpretations made by Dr. Nicole Kindred. The full report will follow in a separate note.

## 2020-11-25 NOTE — Progress Notes (Signed)
[facility]  Patient Name: Ronnie Gonzalez MRN: [id number] Date of Birth: [birth year] Age: 80 y.o. Education: 12 years  Measurement properties of test scores: IQ, Index, and Standard Scores (SS): Mean = 100; Standard Deviation = 15 Scaled Scores (Ss): Mean = 10; Standard Deviation = 3 Z scores (Z): Mean = 0; Standard Deviation = 1 T scores (T); Mean = 50; Standard Deviation = 10  TEST SCORES:    Note: This summary of test scores accompanies the interpretive report and should not be interpreted by unqualified individuals or in isolation without reference to the report. Test scores are relative to age, gender, and educational history as available and appropriate.   Performance Validity        "A" Random Letter Test Raw  Descriptor      Errors 0 Within Expectation      Embedded Measures: Raw  Descriptor      NAB Effort Index 4 Below Expectation      Mental Status Screening     Total Score Descriptor  MMSE 25 MCI      Expected Functioning        Wide Range Achievement Test: Standard/Scaled Score Percentile      Word Reading 104 61      Reynolds Intellectual Screening Test Standard/T-score Percentile      Guess What 38 12      Odd Item Out 66 95  RIST Index 104 61      Attention/Processing Speed        Neuropsychological Assessment Battery (Attention Module, Form 1): Scaled/T-score Percentile      Digits Forward 47 38      Digits Backwards 47 38      Repeatable Battery for the Assessment of Neuropsychological Status (Form A): Standard Score Percentile     Coding 7 16      Memory:        Neuropsychological Assessment Battery (Memory Module, Form 1): T-score/Standard Score Percentile  Memory Index (MEM): 74 4      List Learning           List A Immediate Recall   (4 , 6 , 6) 47 38         List B Immediate Recall   (4) 57 75         List A Short Delayed Recall   (0) 29 2         List A Long Delayed Recall   (1) 37 9          List A Percent Retention   (0 %) --- <1         List A Long Delayed Yes/No Recognition Hits   (11) --- 54         List A Long Delayed Yes/No Recognition False Alarms   (15) --- 1         List A Recognition Discriminability Index --- <1      Shape Learning           Immediate Recognition   (2 , 1 , 6) 41 18         Delayed Recognition   (3) 44 27         Percent Retention   (50 %) --- 12         Delayed Forced-Choice Recognition Hits   (6) --- 9         Delayed Forced-Choice Recognition False Alarms   (3) --- 31  Delayed Forced-Choice Recognition Discriminability --- 14      Story Learning           Immediate Recall   (2, 18) 27 1         Delayed Recall   (0) 36 8         Percent Retention   (0 %) --- <1      Daily Living Memory            Immediate Recall   (20, 11) 44 27          Delayed Recall   (3, 0) 26 1          Percent Retention (23 %) --- <1          Recognition Hits   (5) --- 2      Repeatable Battery for the Assessment of Neuropsychological Status (Form A): Scaled Score Percentile         Figure Recall   (0) 1 <1      Visuospatial/Constructional Functioning        Repeatable Battery for the Assessment of Neuropsychological Status (Form A): Standard/Scaled Score Percentile      Visuospatial/Constructional Index 126 96         Figure Copy   (20) 14 91         Judgment of Line Orientation   (18) --- >75      Executive Functioning        Clock Drawing Raw Score Descriptor      Command 10 WNL      Rating Scales        Clinical Dementia Rating Raw Score Descriptor      Sum of Boxes 3.5 Very Mild Dementia      Global Score 0.5 MCI      Quick Dementia Rating System Raw Score Descriptor      Sum of Boxes 2.5 Very Mild Dementia      Total Score 4.5 MCI  Geriatric Depression Scale - Short Form 0 WNL   Jamison Yuhasz V. Nicole Kindred PsyD, [place] Clinical Neuropsychologist

## 2020-11-27 NOTE — Progress Notes (Signed)
[facility]  Patient Name: Ronnie Gonzalez MRN: [id number] Date of Birth: [birth year] Age: 80 y.o. Education: 72 years  Clinical Impressions  Ronnie Gonzalez is a 80 y.o., right-hand dominant, married man with a history of thinking and memory problems that began over the past year or so according to his wife. The patient himself did not have good insight and thinks that nothing his wrong. Nevertheless, his wife and children report that he is getting confused easily, that he repeats himself, and that he has a hard time tracking appointments. He also has some possible confabulation, maintaining that he has run in the [place] and [place] despite the fact that is not true as per his wife. He was noted to cover over cognitive errors during the clinical interview and also to have limited tolerance for testing, abruptly discontinuing the battery that had been planned because he didn't have the patience for it. We did get through most critical portions however, including memory testing. He has no neuroimaging on file.   On neuropsychological testing, Mr. Ronnie Gonzalez is demonstrating significant memory problems with a pattern concerning for a storage problem (more notable on verbally than visually mediated measures). He did have preserved scores on one measure of visual memory. I suspect he has significant cognitive reserve in that area, given a superior range score on a measure of visually mediated intellectual abilities and superior visuospatial/constructional performance. He performed normally in all other domains assessed. Executive functions and language were not well captured because he discontinued the testing battery before measures of these domains were administered. He screened negative for the presence of depression. His wife characterized him as functioning at a mild cognitive impairment to very mild dementia level, which is consistent with his CDR  rating.   Mr. Ronnie Gonzalez is thus demonstrating significant memory impairment with a pattern concerning for a storage type problem. Given his relatively well preserved real-world functioning, amnestic MCI presents as the best diagnosis, although he is certainly at risk for decline. This is most likely a developing Alzheimer's clinical syndrome, although I also question the possibility of some component of wernicke-korsakoff given his confabulation and level of alcohol consumption. If not already pursued, thiamine supplementation and reversible laboratory workup are recommended. Recommend that he return for reevaluation in 1 to 2 years as needed. Antidementia medication could be considered as deemed appropriate by the referring provider. He may benefit from [place] and increasing exercise to the extent possible. Will also recommend that he consider decreasing his alcohol intake.   Diagnostic Impressions: Amnestic mild cognitive impairment Excessive consumption of alcohol  Recommendations to be discussed with patient  Your performance and presentation on assessment today were consistent with mainly memory problems. Memory involves a couple of different components including encoding (I.e., getting information in), consolidation (I.e., storing that information across time) and your performance was normal in other areas. These findings, in the context of your clinical history, suggest a mild cognitive impairment level problem.   The major difference between mild cognitive impairment (MCI) and dementia is in severity and potential prognosis. Once someone reaches a level of severity adequate to be diagnosed with a dementia, there is usually progression over time, though this may be years. On the other hand, mild cognitive impairment, while a significant risk for dementia in future, does not always progress to dementia, and in some instances stays the same or can even revert to normal. It is important to  realize that if MCI is  due to underlying Alzheimer's disease, it will most likely progress to dementia eventually. The rate of conversion to Alzheimer's dementia from amnestic MCI is about 15% per year versus the general population risk of conversion of 2% per year.   With respect to what is causing your difficulties, I think there are two primary possibilities. One is that you are developing Alzheimer's disease. The other possibility is that there is a component of wernicke-korsakoff syndrome, which is a disorder caused by nutritional deficiences that can be caaused by alcohol use. Specifically, alcohol use causes thiamin deficiency, which can then result in memory problems. This condition typically involves "confabulation," or making up stories of things that didn't happen to fill in gaps in memory. If you didn't actually run the [place], as your wife maintains, then these are examples of confabulation. There were also some findings suggestive of confabulation on your test data.   First, it is important that you work with Dr. Sheryn Bison to make sure that there are no reversible causes for your condition such as thiamin deficiency. Laboratory testing for thiamin is notoriously unreliable and so even if that is normal, you may wish to begin supplementation, which you could discuss with Dr. Sheryn Bison.   Second, I would recommend that you decrease your alcohol consumption to within established safe limits for age, which is around 1 drink per day for a man your age.   Third, there are dietary and lifestyle changes that can help if this in fact has nothing to do with your alcohol use and is instead incipient Alzheimer's disease. These generally include diet and exercise. I am sorry that you are no longer able to run related to your physical issues, you may consider alternate forms of exercise such as stationary bicycle, walking, which can be done with a weighted backback (colloquially  referred to as "rucking"), water aerobics and the like.   There is now good quality evidence from at least one large scale study that a modified mediterranean diet may help slow cognitive decline. This is known as the "MIND" diet. The Mind diet is not so much a specific diet as it is a set of recommendations for things that you should and should not eat.   Foods that are ENCOURAGED on the MIND Diet:  Green, leafy vegetables: Aim for six or more servings per week. This includes kale, spinach, cooked greens and salads.  All other vegetables: Try to eat another vegetable in addition to the green leafy vegetables at least once a day. It is best to choose non-starchy vegetables because they have a lot of nutrients with a low number of calories.  Berries: Eat berries at least twice a week. There is a plethora of research on strawberries, and other berries such as blueberries, raspberries and blackberries have also been found to have antioxidant and brain health benefits.  Nuts: Try to get five servings of nuts or more each week. The creators of the [place] don't specify what kind of nuts to consume, but it is probably best to vary the type of nuts you eat to obtain a variety of nutrients. Peanuts are a legume and do not fall into this category.  Olive oil: Use olive oil as your main cooking oil. There may be other heart-healthy alternatives such as algae oil, though there is not yet sufficient research upon which to base a formal recommendation.  Whole grains: Aim for at least three servings daily. Choose minimally processed grains like  oatmeal, quinoa, brown rice, whole-wheat pasta and 100% whole-wheat bread.  Fish: Eat fish at least once a week. It is best to choose fatty fish like salmon, sardines, trout, tuna and mackerel for their high amounts of omega-3 fatty acids.  Beans: Include beans in at least four meals every week. This includes all beans, lentils and soybeans.  Poultry: Try to eat chicken or  [place] at least twice a week. Note that fried chicken is not encouraged on the MIND diet.  Wine: Aim for no more than one glass of alcohol daily. Both red and white wine may benefit the brain. However, much research has focused on the red wine compound resveratrol, which may help protect against Alzheimer's disease.  Foods that are DISCOURAGED on the MIND Diet: Butter and margarine: Try to eat less than 1 tablespoon (about 14 grams) daily. Instead, try using olive oil as your primary cooking fat, and dipping your bread in olive oil with herbs.  Cheese: The MIND diet recommends limiting your cheese consumption to less than once per week.  Red meat: Aim for no more than three servings each week. This includes all beef, pork, lamb and products made from these meats.  Maceo Pro food: The MIND diet highly discourages fried food, especially the kind from fast-food restaurants. Limit your consumption to less than once per week.  Pastries and sweets: This includes most of the processed junk food and desserts you can think of. Ice cream, cookies, brownies, snack cakes, donuts, candy and more. Try to limit these to no more than four times a week.  I would suggest that you return for reevaluation in 1 to 2 years to monitor your progress over time.    Test Findings  Test scores are summarized in additional documentation associated with this encounter. Test scores are relative to age, gender, and educational history as available and appropriate. There were no concerns about performance validity as all findings fell within normal expectations.   General Intellectual Functioning/Achievement:  Performance on single word reading was average. Performance was also average on the RIST index, although there was clinically meaningful variability amongst its constitutent subtests. Performance on the verbally mediated portion was low average. By contrast, performance on the more visually oriented portion was superior. This  suggests significant premorbid strength in visual abilities.   Attention and Processing Efficiency: Indicators of attention and processing efficiency were within normal limits with average scores on both measures. Timed number symbol coding, a graphomotor measures of processing speed and selective attention, was low average.   Language: Language was not assessed as Mr. Jaco discontinued testing. He had no qualitative problems with word finding or verbal fluency in conversational communication.   Visuospatial Function: Performance was very good, nearly errorless, on visuospatial and constructional measures, generating a superior range score. Figure copy was errorless and judgment of angular line orientations was high average.   Learning and Memory: Performance on measures of learning and memory was below expectations for an individual of Mr. Pettigrew ability and the discrepancy between his RIST index and NAB Memory Index score would be expected in < 5% of normal individuals. The number of low subtest scores is also unusual. The pattern suggests a storage problem.   In the verbal realm, Mr. Range demonstrated reasonable learning for a 12-item word list with 4, 6, and 6 words across three learning trials. He did not recall any of those words following a short delay, however, and retained one word on long delayed recall. This generated an  extremely low score for short delayed recall and a weak low average (almost unusually low score) for long-delayed recall. Recognition discriminability was also extremely low with more false positives than correct identifications. Memory for a short story was extremely low with respect to encoding followed by no details recalled. He remarked to the technician that the story was about a witch, when there was in fact no witch involved, potentially representing a confabulation. Memory for brief daily living information was average on immediate recall and extremely  low on delayed recall. Recognition was extremely low.   In the visual realm, he did much better, learning 2, 1, and 6 designs of a possible 9 designs across three learning trials. Delayed recall for the designs was average at 3 designs. His retention was low average. Delayed yes/no recognition for the designs was low average. Delayed recall for a modestly complex geometric figure was not as good with 0 designs recalled.   Executive Functions: Executive functions were not well assessed given the patient's tolerance for test procedures. He did score WNL on clock drawing with normal face, number placement, and hand position.   Rating Scale(s): Mr. Decaprio was characterized as functioning at a mild cognitive impairment to mild dementia range by his wife. I was able to rate a CDR for him and his global score is 0.5 his Sum of Boxes is 3.5, which is concordant with his wife's rating. He screened negative for the presence of depression, also noting that he does not think he has more memory problems than other people his age, suggesting limited awareness.   Viviano Simas Nicole Kindred, PsyD, ABN Clinical Neuropsychologist  Coding and Compliance  Billing below reflects technician time, my direct face-to-face time with the patient, time spent in test administration, and time spent in professional activities including but not limited to: neuropsychological test interpretation, integration of neuropsychological test data with clinical history, report preparation, treatment planning, care coordination, and review of diagnostically pertinent medical history or studies.   Services associated with this encounter: Clinical Interview [phone]) plus 160 minutes ([id number]; Neuropsychological Evaluation by Professional)  95 minutes ([id number]; Neuropsychological Testing by Technician)

## 2020-12-01 ENCOUNTER — Other Ambulatory Visit: Payer: Self-pay

## 2020-12-01 ENCOUNTER — Ambulatory Visit (INDEPENDENT_AMBULATORY_CARE_PROVIDER_SITE_OTHER): Payer: Medicare Other | Admitting: Counselor

## 2020-12-01 DIAGNOSIS — G3184 Mild cognitive impairment, so stated: Secondary | ICD-10-CM | POA: Diagnosis not present

## 2020-12-01 NOTE — Patient Instructions (Signed)
Your performance and presentation on assessment today were consistent with mainly memory problems. Memory involves a couple of different components including encoding (I.e., getting information in), consolidation (I.e., storing that information across time) and your performance was normal in other areas. These findings, in the context of your clinical history, suggest a mild cognitive impairment level problem.   The major difference between mild cognitive impairment (MCI) and dementia is in severity and potential prognosis. Once someone reaches a level of severity adequate to be diagnosed with a dementia, there is usually progression over time, though this may be years. On the other hand, mild cognitive impairment, while a significant risk for dementia in future, does not always progress to dementia, and in some instances stays the same or can even revert to normal.It is important to realize that if MCI is due to underlying Alzheimer's disease, it will most likely progress to dementia eventually. The rate of conversion to Alzheimer's dementiafrom amnestic MCI is about 15% per year versus the general population risk of conversion of 2% per year.  With respect to what is causing your difficulties, I think there are two primary possibilities. One is that you are developing Alzheimer's disease. The other possibility is that there is a component of wernicke-korsakoff syndrome, which is a disorder caused by nutritional deficiences that can be caaused by alcohol use. Specifically, alcohol use causes thiamin deficiency, which can then result in memory problems. This condition typically involves "confabulation," or making up stories of things that didn't happen to fill in gaps in memory. If you didn't actually run the [place] and [place], as your wife maintains, then these are examples of confabulation. There were also some findings suggestive of confabulation on your test data.   First, it is  important that you work with Dr. Sheryn Bison to make sure that there are no reversible causes for your condition such as thiamin deficiency. Laboratory testing for thiamin is notoriously unreliable and so even if that is normal, you may wish to begin supplementation, which you could discuss with Dr. Sheryn Bison.   Second, I would recommend that you decrease your alcohol consumption to within established safe limits for age, which is around 1 drink per day for a man your age.   Third, there are dietary and lifestyle changes that can help if this in fact has nothing to do with your alcohol use and is instead incipient Alzheimer's disease. These generally include diet and exercise. I am sorry that you are no longer able to run related to your physical issues, you may consider alternate forms of exercise such as stationary bicycle, walking, which can be done with a weighted backback (colloquially referred to as "rucking"), water aerobics and the like.   There is now good quality evidence from at least one large scale study that a modified mediterranean diet may help slow cognitive decline. This is known as the "MIND" diet. The Mind diet is not so much a specific diet as it is a set of recommendations for things that you should and should not eat.   Foods that are ENCOURAGED on the MIND Diet:  Green, leafy vegetables: Aim for six or more servings per week. This includes kale, spinach, cooked greens and salads.  All other vegetables: Try to eat another vegetable in addition to the green leafy vegetables at least once a day. It is best to choose non-starchy vegetables because they have a lot of nutrients with a low number of calories.  Berries: Eat berries  at least twice a week. There is a plethora of research on strawberries, and other berries such as blueberries, raspberries and blackberries have also been found to have antioxidant and brain health benefits.  Nuts: Try to get five servings of nuts or more each  week. The creators of the [place] don't specify what kind of nuts to consume, but it is probably best to vary the type of nuts you eat to obtain a variety of nutrients. Peanuts are a legume and do not fall into this category.  Olive oil: Use olive oil as your main cooking oil. There may be other heart-healthy alternatives such as algae oil, though there is not yet sufficient research upon which to base a formal recommendation.  Whole grains: Aim for at least three servings daily. Choose minimally processed grains like oatmeal, quinoa, brown rice, whole-wheat pasta and 100% whole-wheat bread.  Fish: Eat fish at least once a week. It is best to choose fatty fish like salmon, sardines, trout, tuna and mackerel for their high amounts of omega-3 fatty acids.  Beans: Include beans in at least four meals every week. This includes all beans, lentils and soybeans.  Poultry: Try to eat chicken or [place] at least twice a week. Note that fried chicken is not encouraged on the MIND diet.  Wine: Aim for no more than one glass of alcohol daily. Both red and white wine may benefit the brain. However, much research has focused on the red wine compound resveratrol, which may help protect against Alzheimer's disease.  Foods that are DISCOURAGED on the MIND Diet: Butter and margarine: Try to eat less than 1 tablespoon (about 14 grams) daily. Instead, try using olive oil as your primary cooking fat, and dipping your bread in olive oil with herbs.  Cheese: The MIND diet recommends limiting your cheese consumption to less than once per week.  Red meat: Aim for no more than three servings each week. This includes all beef, pork, lamb and products made from these meats.  Maceo Pro food: The MIND diet highly discourages fried food, especially the kind from fast-food restaurants. Limit your consumption to less than once per week.  Pastries and sweets: This includes most of the processed junk food and desserts you can think of. Ice  cream, cookies, brownies, snack cakes, donuts, candy and more. Try to limit these to no more than four times a week.  I would suggest that you return for reevaluation in 1 to 2 years to monitor your progress over time.

## 2020-12-01 NOTE — Progress Notes (Signed)
   [facility]  Feedback Note: I met with Meriam Sprague Knipfer to review the findings resulting from his neuropsychological evaluation. He was accompanied by his wife, [place]. Since the last appointment, he has been about the same.Time was spent reviewing the impressions and recommendations that are detailed in the evaluation report. We discussed impression of amnestic mild cognitive impairment, as reflected in the patient instructions. I was candid with him that Alzheimer's is a primary concern, but we also spent time discussing the many ways excessive alcohol consumption can affect memory. Recommended that he consult with his PCP and cut down drinking under medical supervision, consider thiamin supplementation, etc. We also discussed MIND diet and exercise, he is using the eliptical while he cannot run (has Baker's cyst on his right knee). I took time to explain the findings and answer all the patient's questions. I encouraged Mr. Hegeman to contact me should he have any further questions or if further follow up is desired.   Current Medications and Medical History   Current Outpatient Medications  Medication Sig Dispense Refill  . Multiple Vitamin (MULTIVITAMIN WITH MINERALS) TABS Take 1 tablet by mouth daily.    Marland Kitchen omega-3 acid ethyl esters (LOVAZA) 1 G capsule Take 1 g by mouth daily.     No current facility-administered medications for this visit.    There are no problems to display for this patient.   Mental Status and Behavioral Observations  Jeremih Dearmas Blaydes presented on time to the present encounter and was alert and generally oriented. Speech was normal in rate, rhythm, volume, and prosody. Self-reported mood was "good" and affect was euthymic. Thought process was logical and goal oriented and thought content was appropriate to the topics discussed. There were no safety concerns identified at today's encounter, such as thoughts of harming self or  others.   Plan  Feedback provided regarding the patient's neuropsychological evaluation. We discussed impression of amnestic mild cognitive impairment at risk for decline. He will work with his PCP regarding cutting down alcohol use and presented as motivated to do so. He is already eating a good diet and will consider MIND. Recommended that he return for re-evaluation in 1 to 2 years as needed.  Fumio Vandam Pingleton was encouraged to contact me if any questions arise or if further follow up is desired.   Viviano Simas Nicole Kindred, PsyD, ABN Clinical Neuropsychologist  Service(s) Provided at This Encounter: 27 minutes [phone]; Conjoint therapy with patient present)

## 2020-12-03 ENCOUNTER — Encounter: Payer: Self-pay | Admitting: Counselor

## 2020-12-03 DIAGNOSIS — G3184 Mild cognitive impairment, so stated: Secondary | ICD-10-CM | POA: Insufficient documentation

## 2020-12-03 HISTORY — DX: Mild cognitive impairment of uncertain or unknown etiology: G31.84

## 2020-12-10 ENCOUNTER — Ambulatory Visit: Payer: Medicare Other | Attending: Internal Medicine

## 2020-12-10 ENCOUNTER — Other Ambulatory Visit: Payer: Self-pay

## 2020-12-10 ENCOUNTER — Other Ambulatory Visit (HOSPITAL_BASED_OUTPATIENT_CLINIC_OR_DEPARTMENT_OTHER): Payer: Self-pay

## 2020-12-10 DIAGNOSIS — Z23 Encounter for immunization: Secondary | ICD-10-CM

## 2020-12-10 MED ORDER — COVID-19 MRNA VACC (MODERNA) 100 MCG/0.5ML IM SUSP
INTRAMUSCULAR | 0 refills | Status: DC
  Filled 2020-12-10: qty 0.25, 1d supply, fill #0

## 2020-12-10 NOTE — Progress Notes (Signed)
   Covid-19 Vaccination Clinic  Name:  TURRELL SEVERT    MRN: [id number] DOB: [birth year]  12/10/2020  Mr. Fidel was observed post Covid-19 immunization for 15 minutes without incident. He was provided with Vaccine Information Sheet and instruction to access the V-Safe system.   Mr. Catterton was instructed to call 911 with any severe reactions post vaccine: Marland Kitchen Difficulty breathing  . Swelling of face and throat  . A fast heartbeat  . A bad rash all over body  . Dizziness and weakness   Immunizations Administered    Name Date Dose VIS Date Route   Moderna Covid-19 Booster Vaccine 12/10/2020 11:08 AM 0.25 mL 05/21/2020 Intramuscular   Manufacturer: Moderna   Lot: [id number]   [place]: [id number]

## 2020-12-12 DIAGNOSIS — M1711 Unilateral primary osteoarthritis, right knee: Secondary | ICD-10-CM | POA: Diagnosis not present

## 2020-12-18 DIAGNOSIS — M1711 Unilateral primary osteoarthritis, right knee: Secondary | ICD-10-CM | POA: Diagnosis not present

## 2020-12-25 DIAGNOSIS — M7121 Synovial cyst of popliteal space [Baker], right knee: Secondary | ICD-10-CM | POA: Diagnosis not present

## 2020-12-25 DIAGNOSIS — M1711 Unilateral primary osteoarthritis, right knee: Secondary | ICD-10-CM | POA: Diagnosis not present

## 2021-01-19 DIAGNOSIS — M25562 Pain in left knee: Secondary | ICD-10-CM | POA: Diagnosis not present

## 2021-02-06 DIAGNOSIS — M170 Bilateral primary osteoarthritis of knee: Secondary | ICD-10-CM | POA: Diagnosis not present

## 2021-02-20 DIAGNOSIS — G3184 Mild cognitive impairment, so stated: Secondary | ICD-10-CM | POA: Diagnosis not present

## 2021-03-06 DIAGNOSIS — H353131 Nonexudative age-related macular degeneration, bilateral, early dry stage: Secondary | ICD-10-CM | POA: Diagnosis not present

## 2021-03-11 DIAGNOSIS — R0981 Nasal congestion: Secondary | ICD-10-CM | POA: Diagnosis not present

## 2021-03-11 DIAGNOSIS — J019 Acute sinusitis, unspecified: Secondary | ICD-10-CM | POA: Diagnosis not present

## 2021-03-11 DIAGNOSIS — R059 Cough, unspecified: Secondary | ICD-10-CM | POA: Diagnosis not present

## 2021-03-25 DIAGNOSIS — M1711 Unilateral primary osteoarthritis, right knee: Secondary | ICD-10-CM | POA: Diagnosis not present

## 2021-03-25 DIAGNOSIS — M170 Bilateral primary osteoarthritis of knee: Secondary | ICD-10-CM | POA: Diagnosis not present

## 2021-03-25 DIAGNOSIS — M1712 Unilateral primary osteoarthritis, left knee: Secondary | ICD-10-CM | POA: Diagnosis not present

## 2021-04-01 DIAGNOSIS — L570 Actinic keratosis: Secondary | ICD-10-CM | POA: Diagnosis not present

## 2021-04-01 DIAGNOSIS — C44629 Squamous cell carcinoma of skin of left upper limb, including shoulder: Secondary | ICD-10-CM | POA: Diagnosis not present

## 2021-04-01 DIAGNOSIS — M1711 Unilateral primary osteoarthritis, right knee: Secondary | ICD-10-CM | POA: Diagnosis not present

## 2021-04-01 DIAGNOSIS — L578 Other skin changes due to chronic exposure to nonionizing radiation: Secondary | ICD-10-CM | POA: Diagnosis not present

## 2021-04-01 DIAGNOSIS — D485 Neoplasm of uncertain behavior of skin: Secondary | ICD-10-CM | POA: Diagnosis not present

## 2021-04-01 DIAGNOSIS — M170 Bilateral primary osteoarthritis of knee: Secondary | ICD-10-CM | POA: Diagnosis not present

## 2021-04-01 DIAGNOSIS — M1712 Unilateral primary osteoarthritis, left knee: Secondary | ICD-10-CM | POA: Diagnosis not present

## 2021-04-01 DIAGNOSIS — L821 Other seborrheic keratosis: Secondary | ICD-10-CM | POA: Diagnosis not present

## 2021-04-08 DIAGNOSIS — M1711 Unilateral primary osteoarthritis, right knee: Secondary | ICD-10-CM | POA: Diagnosis not present

## 2021-04-29 DIAGNOSIS — Z0000 Encounter for general adult medical examination without abnormal findings: Secondary | ICD-10-CM | POA: Diagnosis not present

## 2021-04-29 DIAGNOSIS — Z136 Encounter for screening for cardiovascular disorders: Secondary | ICD-10-CM | POA: Diagnosis not present

## 2021-04-29 DIAGNOSIS — R413 Other amnesia: Secondary | ICD-10-CM | POA: Diagnosis not present

## 2021-04-29 DIAGNOSIS — Z8042 Family history of malignant neoplasm of prostate: Secondary | ICD-10-CM | POA: Diagnosis not present

## 2021-04-29 DIAGNOSIS — Z125 Encounter for screening for malignant neoplasm of prostate: Secondary | ICD-10-CM | POA: Diagnosis not present

## 2021-04-29 DIAGNOSIS — Z23 Encounter for immunization: Secondary | ICD-10-CM | POA: Diagnosis not present

## 2021-05-08 ENCOUNTER — Ambulatory Visit (INDEPENDENT_AMBULATORY_CARE_PROVIDER_SITE_OTHER): Payer: Medicare Other | Admitting: Physician Assistant

## 2021-05-08 ENCOUNTER — Encounter: Payer: Self-pay | Admitting: Physician Assistant

## 2021-05-08 ENCOUNTER — Ambulatory Visit: Payer: Medicare Other | Admitting: Student

## 2021-05-08 ENCOUNTER — Other Ambulatory Visit: Payer: Self-pay

## 2021-05-08 VITALS — BP 156/88 | HR 68 | Resp 18 | Ht 70.0 in | Wt 176.0 lb

## 2021-05-08 DIAGNOSIS — Z01818 Encounter for other preprocedural examination: Secondary | ICD-10-CM

## 2021-05-08 DIAGNOSIS — G3184 Mild cognitive impairment, so stated: Secondary | ICD-10-CM

## 2021-05-08 MED ORDER — DONEPEZIL HCL 10 MG PO TABS: ORAL_TABLET | ORAL | 3 refills | Status: DC

## 2021-05-08 NOTE — Progress Notes (Addendum)
Assessment/Plan:   Ronnie Gonzalez is a 80 y.o. year old male with risk factors including  age, alcohol abuse, insomnia, OSA not on CPAP,peripheral polyneuropathy hypertension, hyperlipidemia, CAD, anxiety, depression and  seen today for evaluation of memory loss.  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  Last MoCA was 25/30 in April 2022.  Currently, he is not on medications.   Recommendations:   Amnestic MCI  (Mild Cognitive Impairment with Memory Loss)   MRI brain with/without contrast to assess for underlying structural abnormality and assess vascular load  Discussed safety both in and out of the home.  Discussed the importance of regular daily schedule with inclusion of crossword puzzles to maintain brain function.  Continue to monitor mood with PCP.  Stay active at least 30 minutes at least 3 times a week.  Naps should be scheduled and should be no longer than 60 minutes and should not occur after 2 PM.  Mediterranean diet is recommended  Folllow up 2 months after MRI  Will consider starting ACHI after confirming that there are no EKG abnormalities such as QT prolongation. Discussed weaning off alcohol  Subjective:    The patient is seen in neurologic consultation at the request of [name], MD for the evaluation of memory.  The patient is accompanied by wife who supplements the history. This is a 80 y.o. year old male who has had memory issues for about one year, worse since February of this year.  His wife noted that he was making mistakes regarding appointments.  For example, he was called to confirm this appointment, and he told his wife that he has some appointment in [facility], but he could not remember who had call him what was there for.  He forgets quickly his instructions. His wife states that he is unable to carry a detailed conversation and if there are several people in the conversation he becomes a listener, he does not enjoy partaking in  it.  He will usually respond with generic one-liners.  When playing family games, he cannot follow below the directions of the game, but can certainly follow the directions if broken down into steps when his turn comes.  His memories of the past are skewed.  In the recent conversation regarding 911, he stated that he volunteered in the aftermath, which was not true. 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     He repeats the same questions, or stories.  His mood is good, denies any depression, or irritability.  He sleeps well with the help of sleep aids.  He denies any vivid dreams or sleepwalking, hallucinations or paranoia or leaving objects in unusual places.  He is independent of bathing and dressing, he is compliant with his medications, denies any issues with finances.  His appetite is good, denies trouble swallowing.  His wife always cooked.  He ambulates without difficulty without the use of a walker or a cane.  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  He continues to drive, but he may become lost at times.  He denies any headaches, double vision, dizziness, focal numbness or tingling, unilateral weakness or tremors, urine incontinence or retention, denies constipation or diarrhea.  Denies anosmia.  Denies a history of sleep apnea.  He continues to drink 2 large cans of beer a day, as well as a repetitive after dinner.  He denies tobacco abuse.  Family history remarkable for one uncle, father and grandfather with dementia. Neuropsychological exam prior to this appointment (12/01/20) showed significant memory problems likely due to storage problem, concerning for mild cognitive impairment being at risk for Alzheimer's clinical syndrome with questionable component of  Warnicke-Korsakoff given his confabulation and level of alcohol consumption.        Labs  04/29/21 LDL 101, TSH 0.86, MCV 95.5  o/w nl CBC, B1 240.09, B12 352  Allergies  Allergen Reactions   Other     Current Outpatient Medications  Medication Instructions   COVID-19 mRNA vaccine, Moderna, 100 MCG/0.5ML injection Intramuscular   Multiple Vitamin (MULTIVITAMIN WITH MINERALS) TABS 1 tablet, Daily   omega-3 acid ethyl esters (LOVAZA) 1 g, Daily   tiZANidine (ZANAFLEX) 4 mg, 3 times daily PRN     VITALS:   Vitals:   05/08/21 0752  BP: (!) 156/88  Pulse: 68  Resp: 18  SpO2: 98%  Weight: 176 lb (79.8 kg)  Height: 5\' 10"  (1.778 m)   No flowsheet data found.  PHYSICAL EXAM   HEENT:  Normocephalic, atraumatic. The mucous membranes are moist. The superficial temporal arteries are without ropiness or tenderness. Cardiovascular: Regular rate and rhythm. Lungs: Clear to auscultation bilaterally. Neck: There are no carotid bruits noted bilaterally.  NEUROLOGICAL: No flowsheet data found. MMSE - Mini Mental State Exam 11/24/2020  Orientation to time 5  Orientation to Place 5  Registration 3  Attention/ Calculation 4  Recall 0  Language- name 2 objects 2  Language- repeat 1  Language- follow 3 step command 3  Language- read & follow direction 1  Write a sentence 1  Copy design 0  Total score 25    No flowsheet data found.   Orientation:  Alert and oriented to person, place and  time. No aphasia or dysarthria. Fund of knowledge is appropriate. Recent and remote memory impaired. Attention and concentration are normal.  Able to name objects and repeat phrases. Delayed recall 0/5 Cranial nerves: There is good facial symmetry. Extraocular muscles are intact and visual fields are full to confrontational testing. Speech is fluent and clear. Soft palate rises symmetrically and there is no tongue deviation. Hearing is intact to conversational tone. Tone: Tone is good  throughout. Sensation: Sensation is intact to light touch and pinprick throughout. Vibration is intact at the bilateral big toe.There is no extinction with double simultaneous stimulation. There is no sensory dermatomal level identified. Coordination: The patient has no difficulty with RAM's or FNF bilaterally. Normal finger to nose  Motor: Strength is 5/5 in the bilateral upper and lower extremities. There is no pronator drift. There are no fasciculations noted. DTR's: Deep tendon reflexes are 2/4 at the bilateral biceps, triceps, brachioradialis, patella and achilles.  Plantar responses are downgoing bilaterally. Gait and Station: The patient is able to ambulate without difficulty.The patient is able to heel toe walk without any difficulty.The patient is able to ambulate in a tandem fashion. The patient is able to stand in the Romberg position.     Thank you for allowing [place] the opportunity to participate in the care of this nice patient. Please do not hesitate to contact us for any questions or concerns.   Total time spent on today's visit was 50  minutes, including both face-to-face time and nonface-to-face time.  Time  included that spent on review of records (prior notes available to me/labs/imaging if pertinent), discussing treatment and goals, answering patient's questions and coordinating care.  Cc:  Briscoe Deutscher, MD  Sharene Butters 05/08/2021 8:09 AM

## 2021-05-08 NOTE — Progress Notes (Signed)
EKG 05/08/2021: Sinus bradycardia at a rate of 59 bpm.  Normal axis.  Poor R wave progression, probably normal variant.  No evidence of ischemia or injury pattern.  Normal QT interval.   Alethia Berthold, PA-C 05/08/2021, 5:00 PM Office: [phone]   CC: Sharene Butters, PA-C

## 2021-05-08 NOTE — Patient Instructions (Addendum)
It was a pleasure to see you today at our office.   Recommendations:  MRI of the brain, the radiology office will call you to arrange you appointment Check EKG today to evaluate for abnormalities before Donepezil We will start donepezil half tablet (5mg ) daily for 2  weeks.  If you are tolerating the medication, then after 2 weeks, we will increase the dose to a full tablet of 10 mg daily.  Side effects include nausea, vomiting, diarrhea, vivid dreams, and muscle cramps.  Please call the clinic if you experience any of these symptoms.  Follow up once the results MRI are available   RECOMMENDATIONS FOR ALL PATIENTS WITH MEMORY PROBLEMS: 1. Continue to exercise (Recommend 30 minutes of walking everyday, or 3 hours every week) 2. Increase social interactions - continue going to [place] and enjoy social gatherings with friends and family 3. Eat healthy, avoid fried foods and eat more fruits and vegetables 4. Maintain adequate blood pressure, blood sugar, and blood cholesterol level. Reducing the risk of stroke and cardiovascular disease also helps promoting better memory. 5. Avoid stressful situations. Live a simple life and avoid aggravations. Organize your time and prepare for the next day in anticipation. 6. Sleep well, avoid any interruptions of sleep and avoid any distractions in the bedroom that may interfere with adequate sleep quality 7. Avoid sugar, avoid sweets as there is a strong link between excessive sugar intake, diabetes, and cognitive impairment We discussed the Mediterranean diet, which has been shown to help patients reduce the risk of progressive memory disorders and reduces cardiovascular risk. This includes eating fish, eat fruits and green leafy vegetables, nuts like almonds and hazelnuts, walnuts, and also use olive oil. Avoid fast foods and fried foods as much as possible. Avoid sweets and sugar as sugar use has been linked to worsening of memory function.  There is always a  concern of gradual progression of memory problems. If this is the case, then we may need to adjust level of care according to patient needs. Support, both to the patient and caregiver, should then be put into place.   FALL PRECAUTIONS: Be cautious when walking. Scan the area for obstacles that may increase the risk of trips and falls. When getting up in the mornings, sit up at the edge of the bed for a few minutes before getting out of bed. Consider elevating the bed at the head end to avoid drop of blood pressure when getting up. Walk always in a well-lit room (use night lights in the walls). Avoid area rugs or power cords from appliances in the middle of the walkways. Use a walker or a cane if necessary and consider physical therapy for balance exercise. Get your eyesight checked regularly.  FINANCIAL OVERSIGHT: Supervision, especially oversight when making financial decisions or transactions is also recommended.  HOME SAFETY: Consider the safety of the kitchen when operating appliances like stoves, microwave oven, and blender. Consider having supervision and share cooking responsibilities until no longer able to participate in those. Accidents with firearms and other hazards in the house should be identified and addressed as well.   ABILITY TO BE LEFT ALONE: If patient is unable to contact 911 operator, consider using LifeLine, or when the need is there, arrange for someone to stay with patients. Smoking is a fire hazard, consider supervision or cessation. Risk of wandering should be assessed by caregiver and if detected at any point, supervision and safe proof recommendations should be instituted.  MEDICATION SUPERVISION: Inability to self-administer  medication needs to be constantly addressed. Implement a mechanism to ensure safe administration of the medications.   DRIVING: Regarding driving, in patients with progressive memory problems, driving will be impaired. We advise to have someone else do  the driving if trouble finding directions or if minor accidents are reported. Independent driving assessment is available to determine safety of driving.   If you are interested in the driving assessment, you can contact the following:  [employer] in [place] [phone] or [phone]    [place] refers to food and lifestyle choices that are based on the traditions of countries located on the [employer]. This way of eating has been shown to help prevent certain conditions and improve outcomes for people who have chronic diseases, like kidney disease and heart disease. What are tips for following this plan? Lifestyle  Cook and eat meals together with your family, when possible. Drink enough fluid to keep your urine clear or pale yellow. Be physically active every day. This includes: Aerobic exercise like running or swimming. Leisure activities like gardening, walking, or housework. Get 7-8 hours of sleep each night. If recommended by your health care provider, drink red wine in moderation. This means 1 glass a day for nonpregnant women and 2 glasses a day for men. A glass of wine equals 5 oz (150 mL). Reading food labels  Check the serving size of packaged foods. For foods such as rice and pasta, the serving size refers to the amount of cooked product, not dry. Check the total fat in packaged foods. Avoid foods that have saturated fat or trans fats. Check the ingredients list for added sugars, such as corn syrup. Shopping  At the grocery store, buy most of your food from the areas near the walls of the store. This includes: Fresh fruits and vegetables (produce). Grains, beans, nuts, and seeds. Some of these may be available in unpackaged forms or large amounts (in bulk). Fresh seafood. Poultry and eggs. Low-fat  dairy products. Buy whole ingredients instead of prepackaged foods. Buy fresh fruits and vegetables in-season from local farmers markets. Buy frozen fruits and vegetables in resealable bags. If you do not have access to quality fresh seafood, buy precooked frozen shrimp or canned fish, such as tuna, salmon, or sardines. Buy small amounts of raw or cooked vegetables, salads, or olives from the deli or salad bar at your store. Stock your pantry so you always have certain foods on hand, such as olive oil, canned tuna, canned tomatoes, rice, pasta, and beans. Cooking  Cook foods with extra-virgin olive oil instead of using butter or other vegetable oils. Have meat as a side dish, and have vegetables or grains as your main dish. This means having meat in small portions or adding small amounts of meat to foods like pasta or stew. Use beans or vegetables instead of meat in common dishes like chili or lasagna. Experiment with different cooking methods. Try roasting or broiling vegetables instead of steaming or sauteing them. Add frozen vegetables to soups, stews, pasta, or rice. Add nuts or seeds for added healthy fat at each meal. You can add these to yogurt, salads, or vegetable dishes. Marinate fish or vegetables using olive oil, lemon juice, garlic, and fresh herbs. Meal planning  Plan to eat 1 vegetarian meal one day each week. Try to work up to 2 vegetarian meals, if possible. Eat seafood 2  or more times a week. Have healthy snacks readily available, such as: Vegetable sticks with hummus. Greek yogurt. Fruit and nut trail mix. Eat balanced meals throughout the week. This includes: Fruit: 2-3 servings a day Vegetables: 4-5 servings a day Low-fat dairy: 2 servings a day Fish, poultry, or lean meat: 1 serving a day Beans and legumes: 2 or more servings a week Nuts and seeds: 1-2 servings a day Whole grains: 6-8 servings a day Extra-virgin olive oil: 3-4 servings a day Limit red meat and  sweets to only a few servings a month What are my food choices? Mediterranean diet Recommended Grains: Whole-grain pasta. Brown rice. Bulgar wheat. Polenta. Couscous. Whole-wheat bread. [place]. Vegetables: Artichokes. Beets. Broccoli. Cabbage. Carrots. Eggplant. Green beans. Chard. Kale. Spinach. Onions. Leeks. Peas. Squash. Tomatoes. Peppers. Radishes. Fruits: Apples. Apricots. Avocado. Berries. Bananas. Cherries. Dates. Figs. Grapes. Lemons. Melon. Oranges. Peaches. Plums. Pomegranate. Meats and other protein foods: Beans. Almonds. Sunflower seeds. Pine nuts. Peanuts. [place]. Salmon. Scallops. Shrimp. [place]. Tilapia. Clams. Oysters. Eggs. Dairy: Low-fat milk. Cheese. Greek yogurt. Beverages: Water. Red wine. Herbal tea. Fats and oils: Extra virgin olive oil. Avocado oil. Grape seed oil. Sweets and desserts: [place] yogurt with honey. Baked apples. Poached pears. Trail mix. Seasoning and other foods: Basil. Cilantro. Coriander. Cumin. Mint. Parsley. Sage. Rosemary. Tarragon. Garlic. Oregano. Thyme. Pepper. Balsalmic vinegar. Tahini. Hummus. Tomato sauce. Olives. Mushrooms. Limit these Grains: Prepackaged pasta or rice dishes. Prepackaged cereal with added sugar. Vegetables: Deep fried potatoes (french fries). Fruits: Fruit canned in syrup. Meats and other protein foods: Beef. Pork. Lamb. Poultry with skin. Hot dogs. Berniece Salines. Dairy: Ice cream. Sour cream. Whole milk. Beverages: Juice. Sugar-sweetened soft drinks. Beer. Liquor and spirits. Fats and oils: Butter. Canola oil. Vegetable oil. Beef fat (tallow). Lard. Sweets and desserts: Cookies. Cakes. Pies. Candy. Seasoning and other foods: Mayonnaise. Premade sauces and marinades. The items listed may not be a complete list. Talk with your dietitian about what dietary choices are right for you. Summary The Mediterranean diet includes both food and lifestyle choices. Eat a variety of fresh fruits and vegetables, beans, nuts, seeds, and whole  grains. Limit the amount of red meat and sweets that you eat. Talk with your health care provider about whether it is safe for you to drink red wine in moderation. This means 1 glass a day for nonpregnant women and 2 glasses a day for men. A glass of wine equals 5 oz (150 mL). This information is not intended to replace advice given to you by your health care provider. Make sure you discuss any questions you have with your health care provider. Document Released: 03/11/2016 Document Revised: 04/13/2016 Document Reviewed: 03/11/2016 Elsevier Interactive Patient Education  2017 [employer].     We have sent a referral to [facility] for your MRI and they will call you directly to schedule your appointment. They are located at [place]. If you need to contact them directly please call [phone].

## 2021-05-09 ENCOUNTER — Ambulatory Visit
Admission: RE | Admit: 2021-05-09 | Discharge: 2021-05-09 | Disposition: A | Discharge status: Home / Self Care | Payer: Medicare Other | Source: Ambulatory Visit | Attending: Physician Assistant | Admitting: Physician Assistant

## 2021-05-09 DIAGNOSIS — G3184 Mild cognitive impairment, so stated: Secondary | ICD-10-CM | POA: Diagnosis not present

## 2021-05-09 DIAGNOSIS — I6782 Cerebral ischemia: Secondary | ICD-10-CM | POA: Diagnosis not present

## 2021-05-09 IMAGING — MR MR HEAD W/O CM
10 series · 48 of 48 positions shown · non-contrast
Comparison: No pertinent prior exams available for comparison.

CLINICAL DATA: Amnestic mild cognitive impairment with memory loss.
Memory loss.

EXAM:
MRI HEAD WITHOUT CONTRAST
TECHNIQUE: Multiplanar, multiecho pulse sequences of the brain and surrounding
structures were obtained without intravenous contrast.

[Series 2: T1 · sagittal · 5.0mm · 0.45mm/px · 2 of 21 slices shown]
[im 1/21]
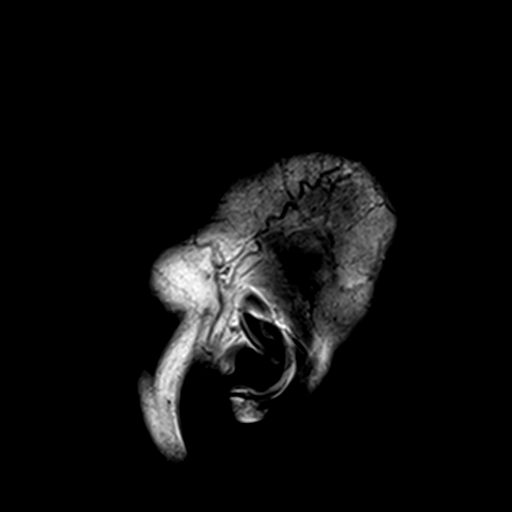
[im 21/21]
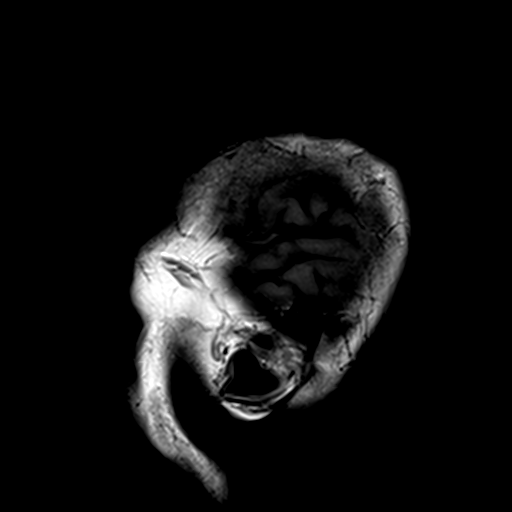

[Series 3: DWI · axial · 3.0mm · 1.80mm/px · z∈[-50,+91]mm · 9 of 100 slices shown (1 of 4)]
[im 1/100]
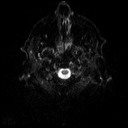
[im 13/100]
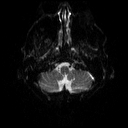
[im 25/100]
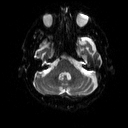
[im 38/100]
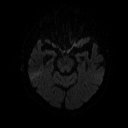
[im 50/100]
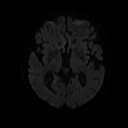
[im 62/100]
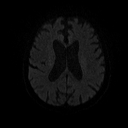
[im 75/100]
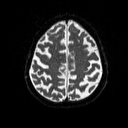
[im 87/100]
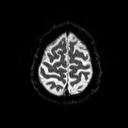
[im 100/100]
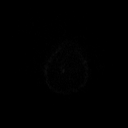

[Series 4: DWI · axial · 3.0mm · 1.80mm/px · z∈[-50,+91]mm · 4 of 50 slices shown (2 of 4)]
[im 1/50]
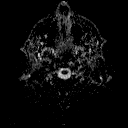
[im 17/50]
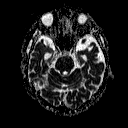
[im 33/50]
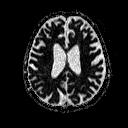
[im 50/50]
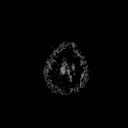

[Series 5: DWI · coronal · 5.0mm · 1.80mm/px · 6 of 67 slices shown (3 of 4)]
[im 1/67]
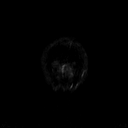
[im 14/67]
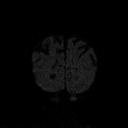
[im 27/67]
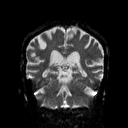
[im 40/67]
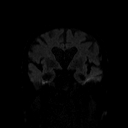
[im 53/67]
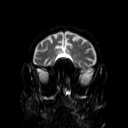
[im 67/67]
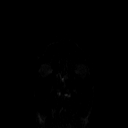

[Series 6: DWI · coronal · 5.0mm · 1.80mm/px · 3 of 34 slices shown (4 of 4)]
[im 1/34]
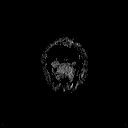
[im 17/34]
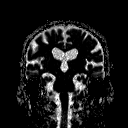
[im 34/34]
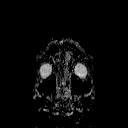

[Series 7: T2 · axial · 5.0mm · 0.51mm/px · z∈[-49,+91]mm · 2 of 22 slices shown (1 of 2)]
[im 1/22]
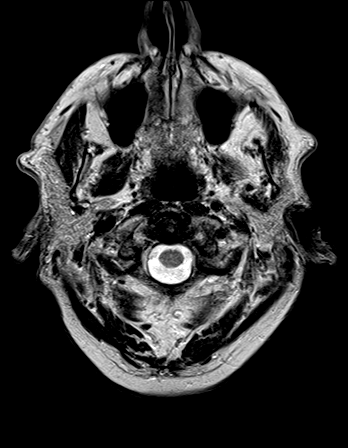
[im 22/22]
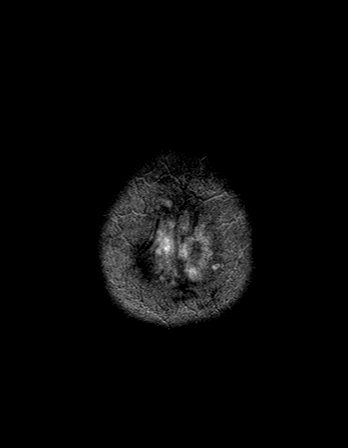

[Series 8: FLAIR · axial · 3.0mm · 0.45mm/px · z∈[-50,+91]mm · 3 of 33 slices shown]
[im 1/33]
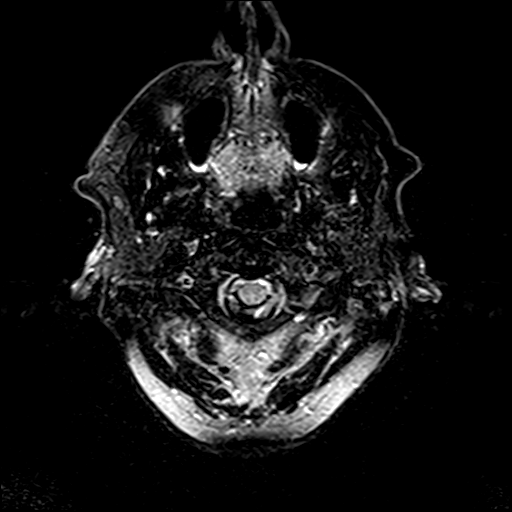
[im 17/33]
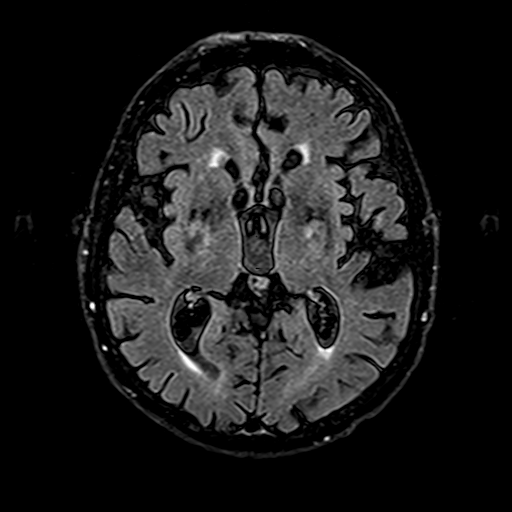
[im 33/33]
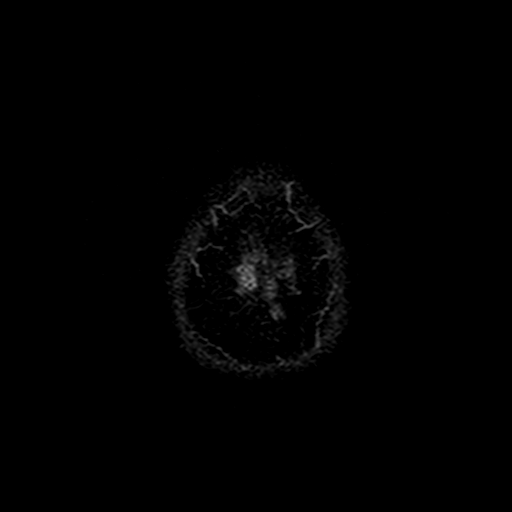

[Series 10: swi_images · axial · 4.0mm · 0.90mm/px · z∈[-54,+95]mm · 4 of 40 slices shown]
[im 1/40]
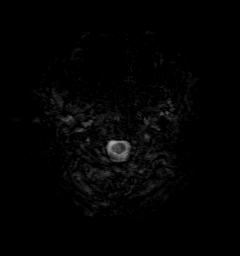
[im 14/40]
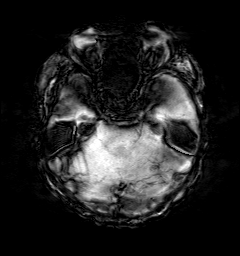
[im 27/40]
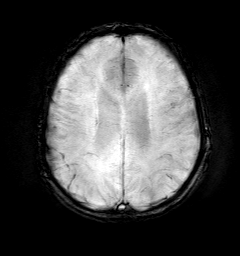
[im 40/40]
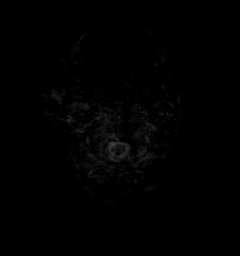

[Series 11: t1_mpr_tra · axial · 1.0mm · 0.71mm/px · z∈[-47,+89]mm · 13 of 144 slices shown]
[im 1/144]
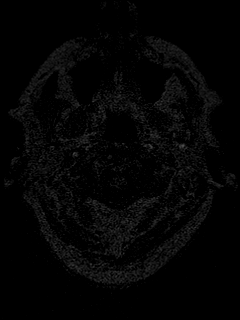
[im 12/144]
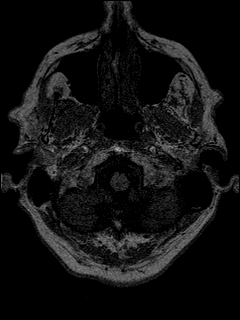
[im 24/144]
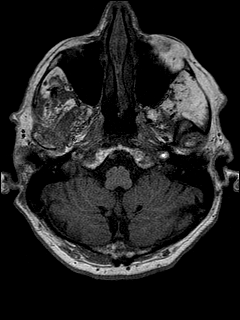
[im 36/144]
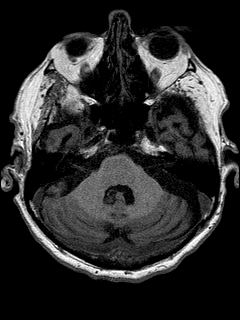
[im 48/144]
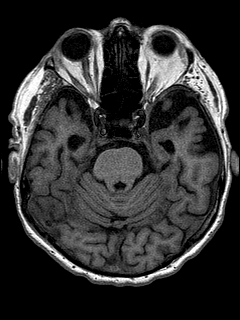
[im 60/144]
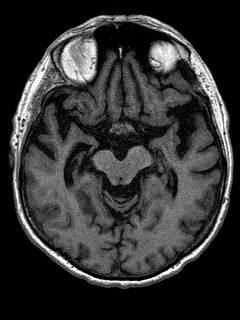
[im 72/144]
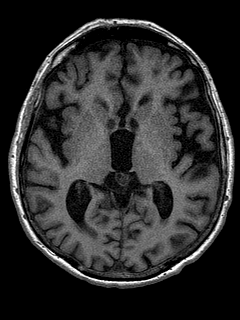
[im 84/144]
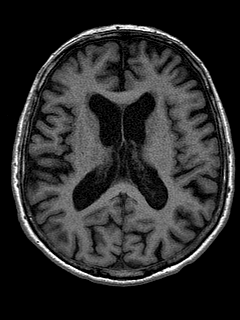
[im 96/144]
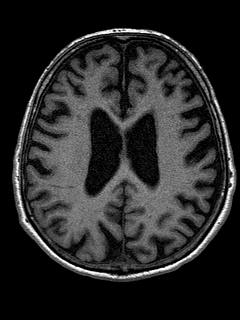
[im 108/144]
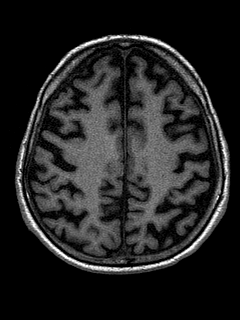
[im 120/144]
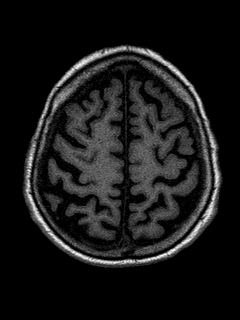
[im 132/144]
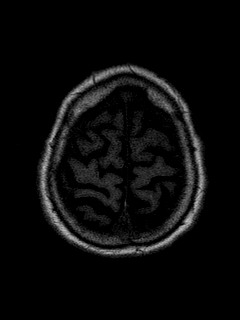
[im 144/144]
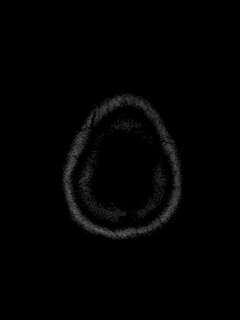

[Series 12: T2 · coronal · 5.0mm · 0.45mm/px · 2 of 25 slices shown (2 of 2)]
[im 1/25]
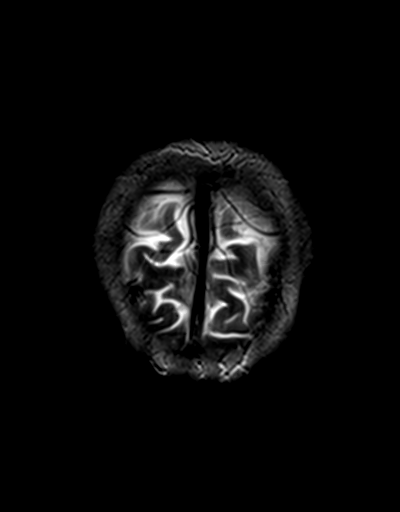
[im 25/25]
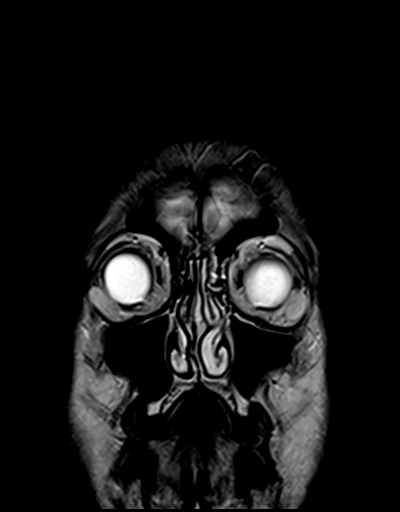

[48 of 48 positions shown; findings below may reference images not displayed]

FINDINGS: Brain:

Mild intermittent motion degradation.

Moderate generalized cerebral atrophy. Comparatively mild cerebellar
atrophy.

Mild multifocal T2 FLAIR hyperintense signal abnormality within the
cerebral white matter, nonspecific but compatible with chronic small
vessel ischemic disease.

There is no acute infarct.

No evidence of an intracranial mass.

No chronic intracranial blood products.

No extra-axial fluid collection.

No midline shift.

Vascular: Signal abnormality is questioned within the distal
cervical right internal carotid artery (at the very caudal extent of
the field of view)(series 7, image 1). Flow voids are otherwise
preserved within the proximal large arterial vessels.

Skull and upper cervical spine: Focal suspicious marrow lesion.

Sinuses/Orbits: Visualized orbits show no acute finding. Bilateral
lens replacements. Trace mucosal thickening within the left ethmoid
air cells.

Impression #4 will be called to the ordering clinician or
representative by the Radiologist Assistant, and communication
documented in the PACS or [REDACTED].
IMPRESSION: No evidence of acute intracranial abnormality.

Mild chronic small vessel ischemic changes within the cerebral white
matter.

Moderate generalized cerebral atrophy. Comparatively mild cerebellar
atrophy.

Signal abnormality is questioned within the distal cervical right
internal carotid artery (at the very caudal extent of the field of
view). MR or CT angiography of the neck is recommended to exclude
right ICA high-grade stenosis or occlusion.

## 2021-05-11 ENCOUNTER — Other Ambulatory Visit: Payer: Self-pay

## 2021-05-11 DIAGNOSIS — G3184 Mild cognitive impairment, so stated: Secondary | ICD-10-CM

## 2021-05-12 ENCOUNTER — Other Ambulatory Visit: Payer: Self-pay | Admitting: Physician Assistant

## 2021-05-12 DIAGNOSIS — G3184 Mild cognitive impairment, so stated: Secondary | ICD-10-CM

## 2021-05-14 ENCOUNTER — Ambulatory Visit
Admission: RE | Admit: 2021-05-14 | Discharge: 2021-05-14 | Disposition: A | Discharge status: Home / Self Care | Payer: Medicare Other | Source: Ambulatory Visit | Attending: Physician Assistant | Admitting: Physician Assistant

## 2021-05-14 ENCOUNTER — Other Ambulatory Visit: Payer: Self-pay

## 2021-05-14 DIAGNOSIS — M47812 Spondylosis without myelopathy or radiculopathy, cervical region: Secondary | ICD-10-CM | POA: Diagnosis not present

## 2021-05-14 DIAGNOSIS — Z0389 Encounter for observation for other suspected diseases and conditions ruled out: Secondary | ICD-10-CM | POA: Diagnosis not present

## 2021-05-14 DIAGNOSIS — G3184 Mild cognitive impairment, so stated: Secondary | ICD-10-CM

## 2021-05-14 DIAGNOSIS — I6523 Occlusion and stenosis of bilateral carotid arteries: Secondary | ICD-10-CM | POA: Diagnosis not present

## 2021-05-14 IMAGING — CT CT ANGIO NECK
2 of 3 series · 9 of 32 positions shown, 14 images · IV contrast (APPLIED)
Comparison: None.

CLINICAL DATA: Concern for stenosis

EXAM:
CT ANGIOGRAPHY NECK
TECHNIQUE: Multidetector CT imaging of the neck was performed using the
standard protocol during bolus administration of intravenous
contrast. Multiplanar CT image reconstructions and MIPs were
obtained to evaluate the vascular anatomy. Carotid stenosis
measurements (when applicable) are obtained utilizing NASCET
criteria, using the distal internal carotid diameter as the
denominator.
CONTRAST:  75mL [WK] IOPAMIDOL ([WK]) INJECTION 76%

[Series 5: carotid angio · axial · 0.50mm/px · z∈[-273,-67]mm · 7 of 139 slices shown, 12 images]
[im 18/139  soft-tissue]
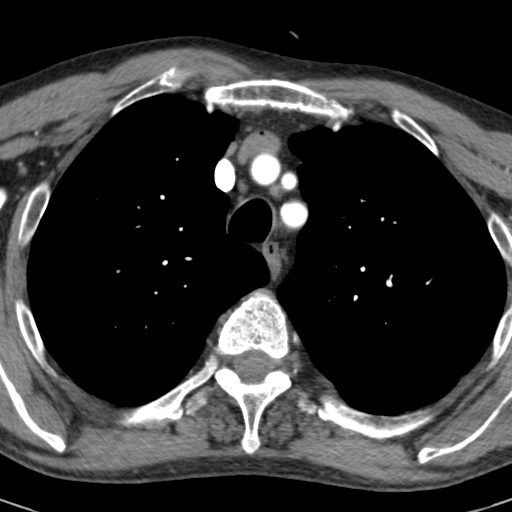
[im 18/139  bone]
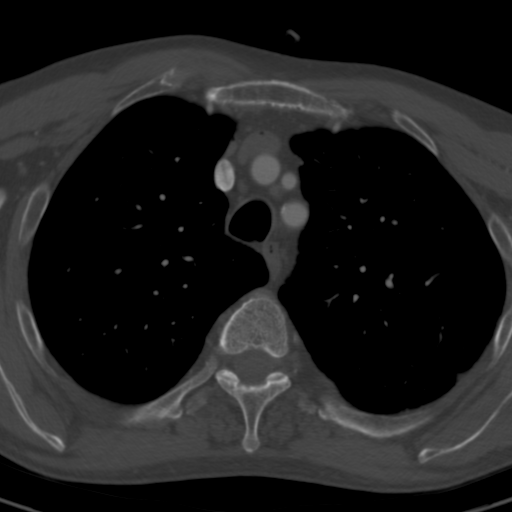
[im 35/139  soft-tissue]
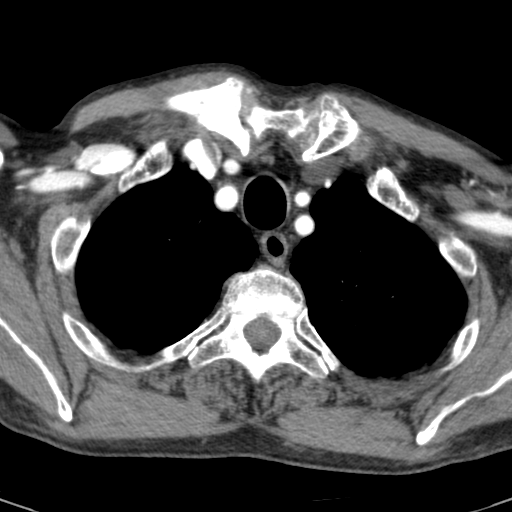
[im 52/139  soft-tissue]
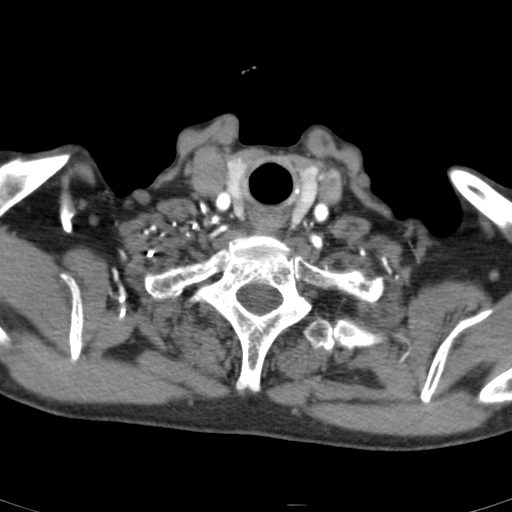
[im 70/139  soft-tissue]
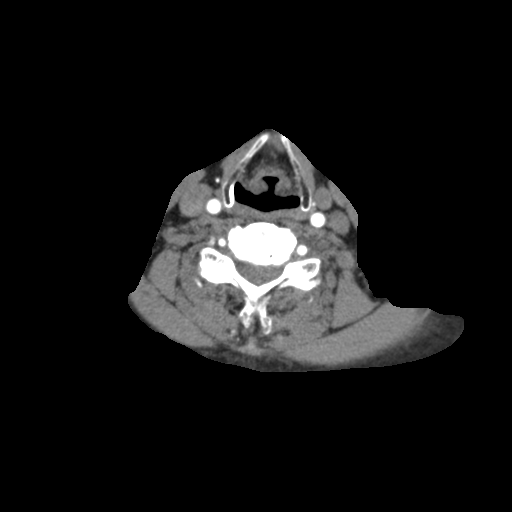
[im 70/139  lung]
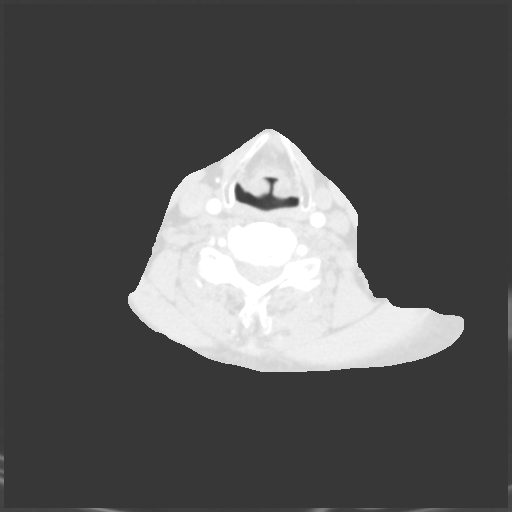
[im 87/139  soft-tissue]
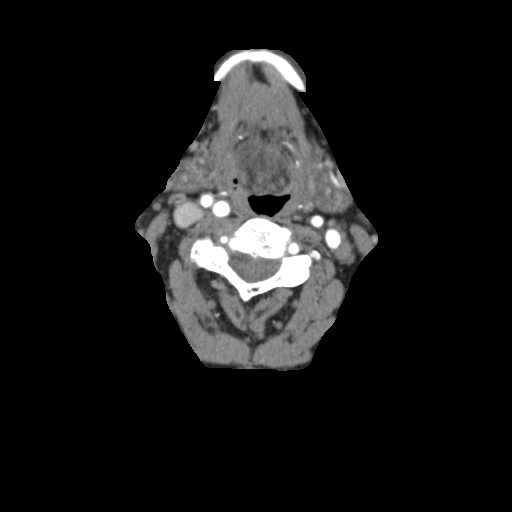
[im 87/139  lung]
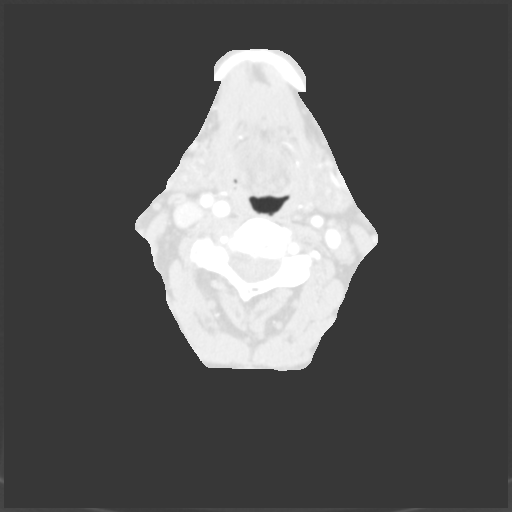
[im 104/139  soft-tissue]
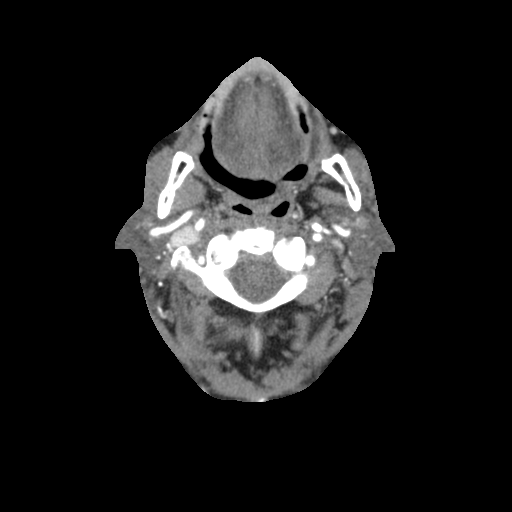
[im 104/139  lung]
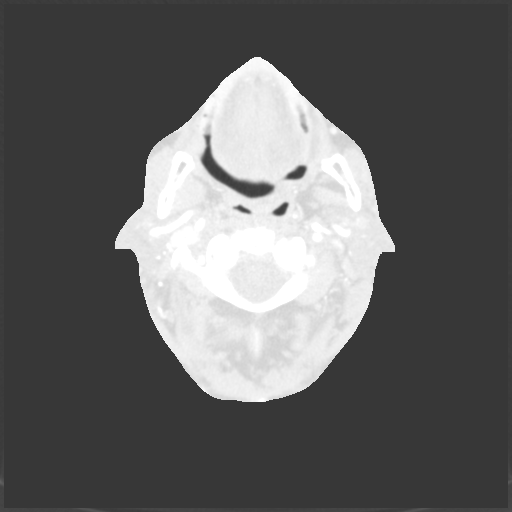
[im 121/139  soft-tissue]
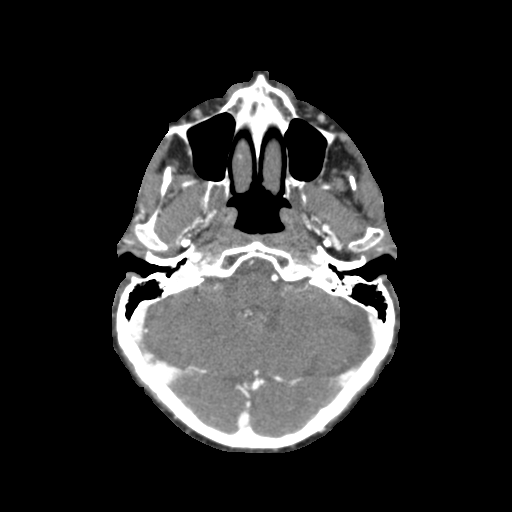
[im 121/139  lung]
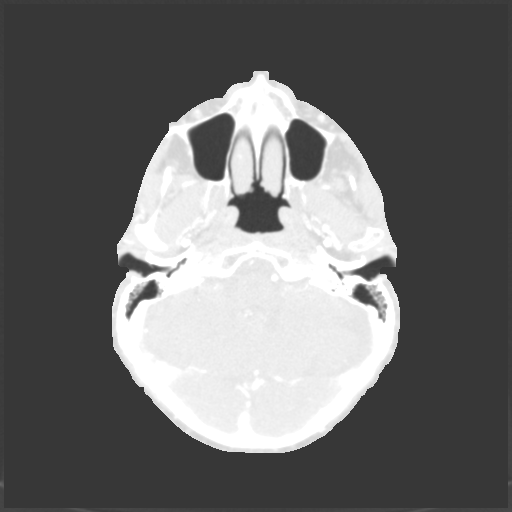

[Series 12: axial thick · axial · 0.50mm/px · z∈[-210,-125]mm · 2 of 53 slices shown]
[im 18/53  soft-tissue]
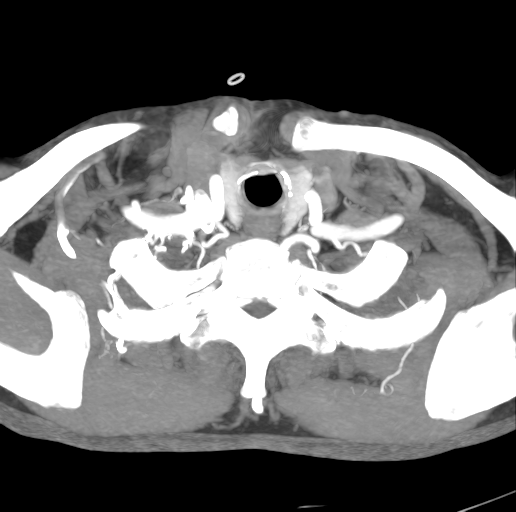
[im 35/53  soft-tissue]
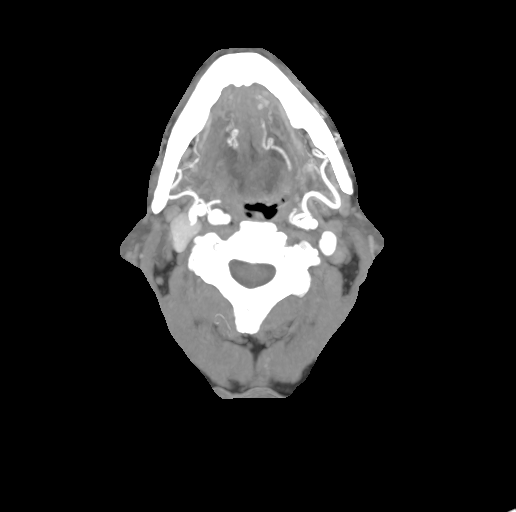

[9 of 32 positions shown; findings below may reference images not displayed]

FINDINGS: Aortic arch: Standard branching. Imaged portion shows no evidence of
aneurysm or dissection. No significant stenosis of the major arch
vessel origins.

Right carotid system: No evidence of dissection, stenosis (50% or
greater) or occlusion. Mild calcifications at the bifurcation.

Left carotid system: No evidence of dissection, stenosis (50% or
greater) or occlusion. Mild calcification of the bifurcation and in
the carotid siphons.

Vertebral arteries: Left dominant. The majority of the right
vertebral artery supplies the right PICA. No evidence of dissection,
stenosis (50% or greater) or occlusion.

Skeleton: Degenerative changes in the cervical spine and at the
sternoclavicular joints. No acute osseous abnormality.

Other neck: Negative.

Upper chest: Negative.
IMPRESSION: No hemodynamically significant stenosis in the neck.

## 2021-05-14 MED ORDER — IOPAMIDOL (ISOVUE-370) INJECTION 76%
75.0000 mL | Freq: Once | INTRAVENOUS | Status: AC | PRN
  Administered 2021-05-14: 75 mL via INTRAVENOUS

## 2021-05-19 ENCOUNTER — Telehealth: Payer: Self-pay | Admitting: Physician Assistant

## 2021-05-19 NOTE — Telephone Encounter (Signed)
Pt daughter is calling christy back regarding results

## 2021-05-19 NOTE — Telephone Encounter (Signed)
Advised to wife, who is on [place]. Verbally understood.

## 2021-05-19 NOTE — Progress Notes (Signed)
Left message to call office at 12:00 05/19/2021

## 2021-05-21 DIAGNOSIS — C44629 Squamous cell carcinoma of skin of left upper limb, including shoulder: Secondary | ICD-10-CM | POA: Diagnosis not present

## 2021-06-08 DIAGNOSIS — M159 Polyosteoarthritis, unspecified: Secondary | ICD-10-CM

## 2021-06-08 DIAGNOSIS — M170 Bilateral primary osteoarthritis of knee: Secondary | ICD-10-CM | POA: Diagnosis not present

## 2021-06-08 HISTORY — DX: Polyosteoarthritis, unspecified: M15.9

## 2021-06-10 ENCOUNTER — Encounter: Payer: Self-pay | Admitting: Physician Assistant

## 2021-06-10 ENCOUNTER — Ambulatory Visit (INDEPENDENT_AMBULATORY_CARE_PROVIDER_SITE_OTHER): Payer: Medicare Other | Admitting: Physician Assistant

## 2021-06-10 ENCOUNTER — Other Ambulatory Visit: Payer: Self-pay

## 2021-06-10 VITALS — BP 139/67 | HR 72 | Resp 20 | Ht 70.0 in | Wt 173.0 lb

## 2021-06-10 DIAGNOSIS — G3184 Mild cognitive impairment, so stated: Secondary | ICD-10-CM | POA: Diagnosis not present

## 2021-06-10 MED ORDER — MEMANTINE HCL 5 MG PO TABS: ORAL_TABLET | ORAL | 11 refills | Status: DC
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
## 2021-06-10 NOTE — Patient Instructions (Addendum)
It was a pleasure to see you today at our office.   Recommendations:  Follow up in 6  months Start Memantine 5 mg tablets.  Take 1 tablet at bedtime for 2 weeks, then 1 tablet twice daily.   Side effects include dizziness, headache, diarrhea or constipation.  Call with any questions or concerns.   Baby Aspirin daily     RECOMMENDATIONS FOR ALL PATIENTS WITH MEMORY PROBLEMS: 1. Continue to exercise (Recommend 30 minutes of walking everyday, or 3 hours every week) 2. Increase social interactions - continue going to [place] and enjoy social gatherings with friends and family 3. Eat healthy, avoid fried foods and eat more fruits and vegetables 4. Maintain adequate blood pressure, blood sugar, and blood cholesterol level. Reducing the risk of stroke and cardiovascular disease also helps promoting better memory. 5. Avoid stressful situations. Live a simple life and avoid aggravations. Organize your time and prepare for the next day in anticipation. 6. Sleep well, avoid any interruptions of sleep and avoid any distractions in the bedroom that may interfere with adequate sleep quality 7. Avoid sugar, avoid sweets as there is a strong link between excessive sugar intake, diabetes, and cognitive impairment We discussed the Mediterranean diet, which has been shown to help patients reduce the risk of progressive memory disorders and reduces cardiovascular risk. This includes eating fish, eat fruits and green leafy vegetables, nuts like almonds and hazelnuts, walnuts, and also use olive oil. Avoid fast foods and fried foods as much as possible. Avoid sweets and sugar as sugar use has been linked to worsening of memory function.  There is always a concern of gradual progression of memory problems. If this is the case, then we may need to adjust level of care according to patient needs. Support, both to the patient and caregiver, should then be put into place.    FALL PRECAUTIONS: Be cautious when walking.  Scan the area for obstacles that may increase the risk of trips and falls. When getting up in the mornings, sit up at the edge of the bed for a few minutes before getting out of bed. Consider elevating the bed at the head end to avoid drop of blood pressure when getting up. Walk always in a well-lit room (use night lights in the walls). Avoid area rugs or power cords from appliances in the middle of the walkways. Use a walker or a cane if necessary and consider physical therapy for balance exercise. Get your eyesight checked regularly.  FINANCIAL OVERSIGHT: Supervision, especially oversight when making financial decisions or transactions is also recommended.  HOME SAFETY: Consider the safety of the kitchen when operating appliances like stoves, microwave oven, and blender. Consider having supervision and share cooking responsibilities until no longer able to participate in those. Accidents with firearms and other hazards in the house should be identified and addressed as well.   ABILITY TO BE LEFT ALONE: If patient is unable to contact 911 operator, consider using LifeLine, or when the need is there, arrange for someone to stay with patients. Smoking is a fire hazard, consider supervision or cessation. Risk of wandering should be assessed by caregiver and if detected at any point, supervision and safe proof recommendations should be instituted.  MEDICATION SUPERVISION: Inability to self-administer medication needs to be constantly addressed. Implement a mechanism to ensure safe administration of the medications.   DRIVING: Regarding driving, in patients with progressive memory problems, driving will be impaired. We advise to have someone else do the driving if trouble  finding directions or if minor accidents are reported. Independent driving assessment is available to determine safety of driving.   If you are interested in the driving assessment, you can contact the following:  [employer]  in [place]  [place] [phone]  [place]  [facility] [phone] or [phone]

## 2021-08-27 DIAGNOSIS — M109 Gout, unspecified: Secondary | ICD-10-CM | POA: Diagnosis not present

## 2021-09-02 DIAGNOSIS — Z85828 Personal history of other malignant neoplasm of skin: Secondary | ICD-10-CM | POA: Diagnosis not present

## 2021-09-02 DIAGNOSIS — Z86018 Personal history of other benign neoplasm: Secondary | ICD-10-CM | POA: Diagnosis not present

## 2021-09-02 DIAGNOSIS — D2271 Melanocytic nevi of right lower limb, including hip: Secondary | ICD-10-CM | POA: Diagnosis not present

## 2021-09-02 DIAGNOSIS — L578 Other skin changes due to chronic exposure to nonionizing radiation: Secondary | ICD-10-CM | POA: Diagnosis not present

## 2021-09-02 DIAGNOSIS — D225 Melanocytic nevi of trunk: Secondary | ICD-10-CM | POA: Diagnosis not present

## 2021-09-02 DIAGNOSIS — D1800 Hemangioma unspecified site: Secondary | ICD-10-CM | POA: Diagnosis not present

## 2021-09-02 DIAGNOSIS — L570 Actinic keratosis: Secondary | ICD-10-CM | POA: Diagnosis not present

## 2021-09-02 DIAGNOSIS — Z808 Family history of malignant neoplasm of other organs or systems: Secondary | ICD-10-CM | POA: Diagnosis not present

## 2021-09-02 DIAGNOSIS — Z23 Encounter for immunization: Secondary | ICD-10-CM | POA: Diagnosis not present

## 2021-09-02 DIAGNOSIS — D485 Neoplasm of uncertain behavior of skin: Secondary | ICD-10-CM | POA: Diagnosis not present

## 2021-09-02 DIAGNOSIS — L821 Other seborrheic keratosis: Secondary | ICD-10-CM | POA: Diagnosis not present

## 2021-10-21 DIAGNOSIS — L570 Actinic keratosis: Secondary | ICD-10-CM | POA: Diagnosis not present

## 2021-10-21 DIAGNOSIS — L988 Other specified disorders of the skin and subcutaneous tissue: Secondary | ICD-10-CM | POA: Diagnosis not present

## 2021-10-21 DIAGNOSIS — D485 Neoplasm of uncertain behavior of skin: Secondary | ICD-10-CM | POA: Diagnosis not present

## 2021-11-01 ENCOUNTER — Observation Stay (HOSPITAL_BASED_OUTPATIENT_CLINIC_OR_DEPARTMENT_OTHER)
Admission: EM | Admit: 2021-11-01 | Discharge: 2021-11-02 | Disposition: A | Discharge status: Home / Self Care | Payer: Medicare Other | Attending: Internal Medicine | Admitting: Internal Medicine

## 2021-11-01 ENCOUNTER — Encounter (HOSPITAL_BASED_OUTPATIENT_CLINIC_OR_DEPARTMENT_OTHER): Payer: Self-pay | Admitting: Obstetrics and Gynecology

## 2021-11-01 ENCOUNTER — Other Ambulatory Visit: Payer: Self-pay

## 2021-11-01 ENCOUNTER — Emergency Department (HOSPITAL_COMMUNITY): Payer: Medicare Other

## 2021-11-01 ENCOUNTER — Emergency Department (HOSPITAL_BASED_OUTPATIENT_CLINIC_OR_DEPARTMENT_OTHER): Payer: Medicare Other

## 2021-11-01 DIAGNOSIS — I639 Cerebral infarction, unspecified: Secondary | ICD-10-CM

## 2021-11-01 DIAGNOSIS — F0390 Unspecified dementia without behavioral disturbance: Secondary | ICD-10-CM

## 2021-11-01 DIAGNOSIS — Z789 Other specified health status: Secondary | ICD-10-CM

## 2021-11-01 DIAGNOSIS — R29898 Other symptoms and signs involving the musculoskeletal system: Secondary | ICD-10-CM | POA: Insufficient documentation

## 2021-11-01 DIAGNOSIS — F1010 Alcohol abuse, uncomplicated: Secondary | ICD-10-CM

## 2021-11-01 DIAGNOSIS — R531 Weakness: Secondary | ICD-10-CM | POA: Diagnosis not present

## 2021-11-01 DIAGNOSIS — Z79899 Other long term (current) drug therapy: Secondary | ICD-10-CM | POA: Diagnosis not present

## 2021-11-01 DIAGNOSIS — Z87891 Personal history of nicotine dependence: Secondary | ICD-10-CM | POA: Diagnosis not present

## 2021-11-01 DIAGNOSIS — Z7982 Long term (current) use of aspirin: Secondary | ICD-10-CM | POA: Insufficient documentation

## 2021-11-01 DIAGNOSIS — I6782 Cerebral ischemia: Secondary | ICD-10-CM | POA: Diagnosis not present

## 2021-11-01 DIAGNOSIS — R200 Anesthesia of skin: Secondary | ICD-10-CM | POA: Diagnosis not present

## 2021-11-01 DIAGNOSIS — R202 Paresthesia of skin: Secondary | ICD-10-CM | POA: Diagnosis not present

## 2021-11-01 HISTORY — DX: Unspecified dementia, mild, without behavioral disturbance, psychotic disturbance, mood disturbance, and anxiety: F03.A0

## 2021-11-01 HISTORY — DX: Cerebral infarction, unspecified: I63.9

## 2021-11-01 LAB — ETHANOL: Alcohol, Ethyl (B): <10 mg/dL (ref <10)

## 2021-11-01 LAB — BASIC METABOLIC PANEL
Anion gap: 9 (ref 5–15)
BUN: 14 mg/dL (ref 8–23)
CO2: 26 mmol/L (ref 22–32)
Calcium: 9.4 mg/dL (ref 8.9–10.3)
Chloride: 105 mmol/L (ref 98–111)
Creatinine, Ser: 0.66 mg/dL (ref 0.61–1.24)
GFR, Estimated: >60 mL/min (ref >60)
Glucose, Bld: 87 mg/dL (ref 70–99)
Potassium: 3.8 mmol/L (ref 3.5–5.1)
Sodium: 140 mmol/L (ref 135–145)

## 2021-11-01 LAB — CBC
HCT: 41.4 % (ref 39.0–52.0)
Hemoglobin: 13.6 g/dL (ref 13.0–17.0)
MCH: 31.3 pg (ref 26.0–34.0)
MCHC: 32.9 g/dL (ref 30.0–36.0)
MCV: 95.2 fL (ref 80.0–100.0)
Platelets: 204 10*3/uL (ref 150–400)
RBC: 4.35 MIL/uL (ref 4.22–5.81)
RDW: 13.7 % (ref 11.5–15.5)
WBC: 5.3 10*3/uL (ref 4.0–10.5)
nRBC: 0 % (ref 0.0–0.2)

## 2021-11-01 LAB — URINALYSIS, ROUTINE W REFLEX MICROSCOPIC
Bilirubin Urine: NEGATIVE
Glucose, UA: NEGATIVE mg/dL
Hgb urine dipstick: NEGATIVE
Ketones, ur: NEGATIVE mg/dL
Leukocytes,Ua: NEGATIVE
Nitrite: NEGATIVE
Protein, ur: NEGATIVE mg/dL
Specific Gravity, Urine: 1.018 (ref 1.005–1.030)
pH: 6.5 (ref 5.0–8.0)

## 2021-11-01 LAB — DIFFERENTIAL
Abs Immature Granulocytes: 0.01 10*3/uL (ref 0.00–0.07)
Basophils Absolute: 0 10*3/uL (ref 0.0–0.1)
Basophils Relative: 1 %
Eosinophils Absolute: 0.1 10*3/uL (ref 0.0–0.5)
Eosinophils Relative: 2 %
Immature Granulocytes: 0 %
Lymphocytes Relative: 29 %
Lymphs Abs: 1.5 10*3/uL (ref 0.7–4.0)
Monocytes Absolute: 0.5 10*3/uL (ref 0.1–1.0)
Monocytes Relative: 9 %
Neutro Abs: 3 10*3/uL (ref 1.7–7.7)
Neutrophils Relative %: 59 %

## 2021-11-01 LAB — APTT: aPTT: 31 seconds (ref 24–36)

## 2021-11-01 LAB — RAPID URINE DRUG SCREEN, HOSP PERFORMED
Amphetamines: NOT DETECTED
Barbiturates: NOT DETECTED
Benzodiazepines: NOT DETECTED
Cocaine: NOT DETECTED
Opiates: NOT DETECTED
Tetrahydrocannabinol: NOT DETECTED

## 2021-11-01 LAB — CBG MONITORING, ED: Glucose-Capillary: 86 mg/dL (ref 70–99)

## 2021-11-01 LAB — PROTIME-INR
INR: 1 (ref 0.8–1.2)
Prothrombin Time: 12.9 seconds (ref 11.4–15.2)

## 2021-11-01 IMAGING — MR MR HEAD W/O CM
10 of 11 series · 42 of 48 positions shown · non-contrast
Comparison: Prior CT from earlier the same day.

CLINICAL DATA: Initial evaluation for neuro deficit, stroke
suspected.

EXAM:
MRI HEAD WITHOUT CONTRAST
TECHNIQUE: Multiplanar, multiecho pulse sequences of the brain and surrounding
structures were obtained without intravenous contrast.

[Series 5: DWI · axial · 3.0mm · 0.88mm/px · z∈[-62,+79]mm · 9 of 96 slices shown (1 of 4)]
[im 1/96]
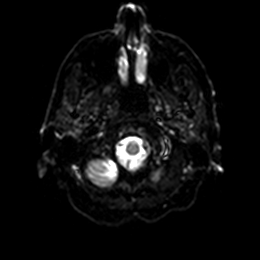
[im 12/96]
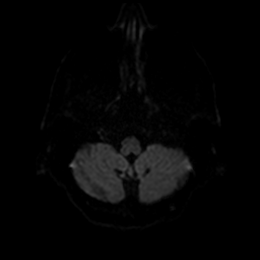
[im 24/96]
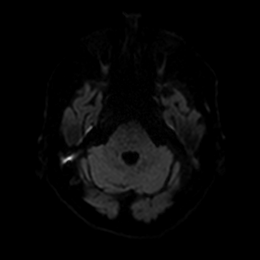
[im 36/96]
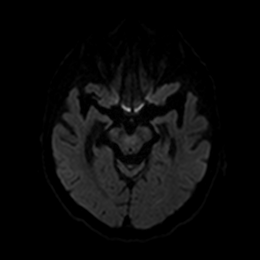
[im 48/96]
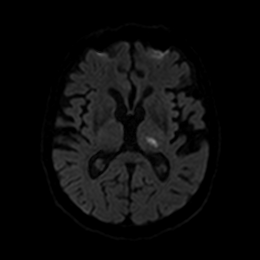
[im 60/96]
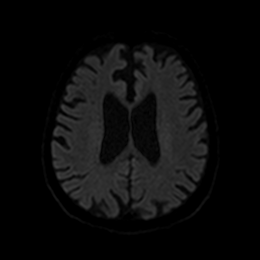
[im 72/96]
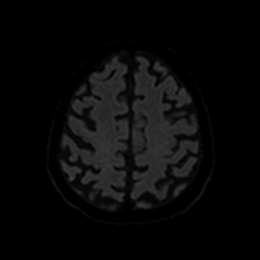
[im 84/96]
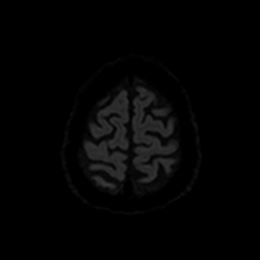
[im 96/96]
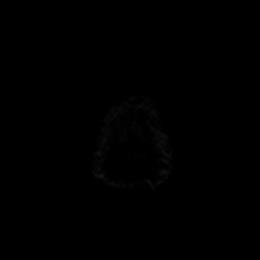

[Series 6: DWI · axial · 3.0mm · 0.88mm/px · z∈[-62,+79]mm · 4 of 48 slices shown (2 of 4)]
[im 1/48]
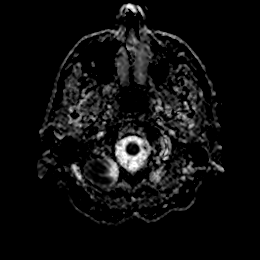
[im 16/48]
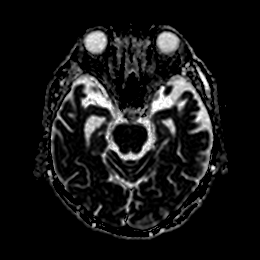
[im 32/48]
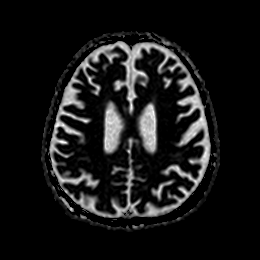
[im 48/48]
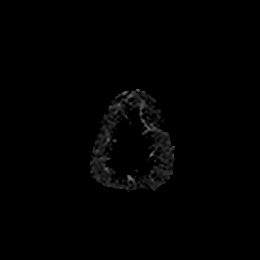

[Series 7: DWI · coronal · 4.0mm · 0.88mm/px · 6 of 64 slices shown (3 of 4)]
[im 1/64]
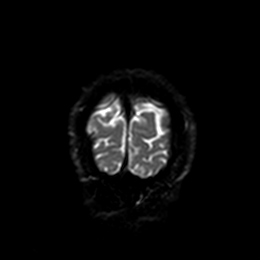
[im 13/64]
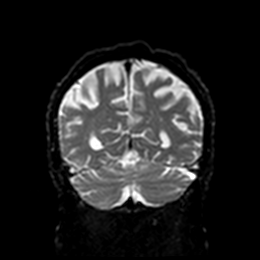
[im 26/64]
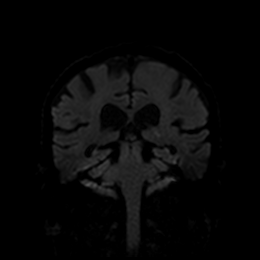
[im 38/64]
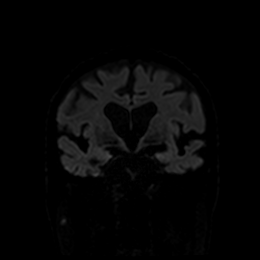
[im 51/64]
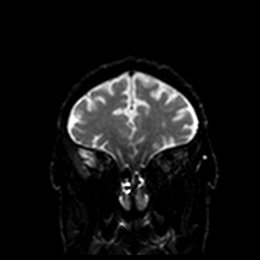
[im 64/64]
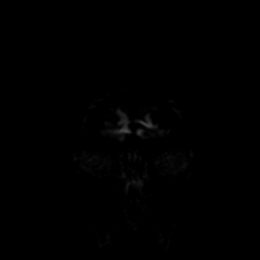

[Series 8: DWI · coronal · 4.0mm · 0.88mm/px · 3 of 31 slices shown (4 of 4)]
[im 1/31]
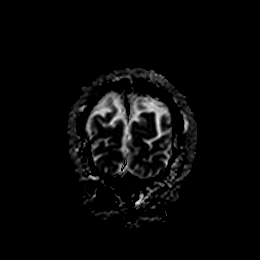
[im 16/31]
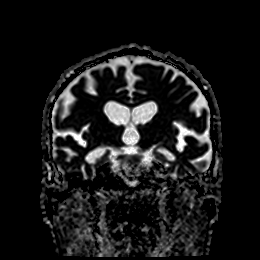
[im 31/31]
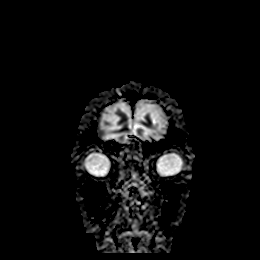

[Series 9: T1 · sagittal · 5.0mm · 0.75mm/px · 2 of 23 slices shown]
[im 1/23]
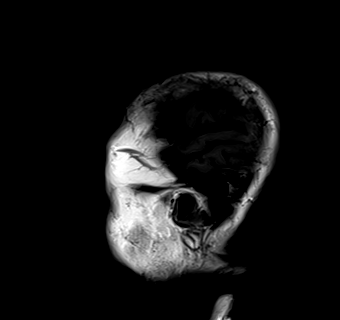
[im 23/23]
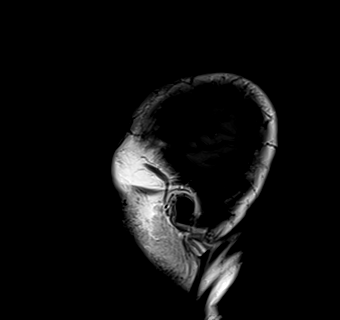

[Series 10: T2 · axial · 5.0mm · 0.72mm/px · z∈[-64,+80]mm · 2 of 25 slices shown (1 of 2)]
[im 1/25]
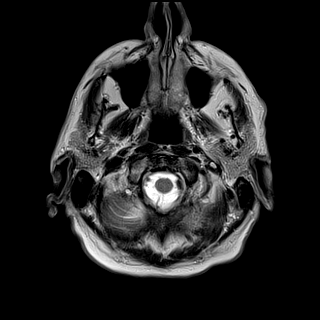
[im 25/25]
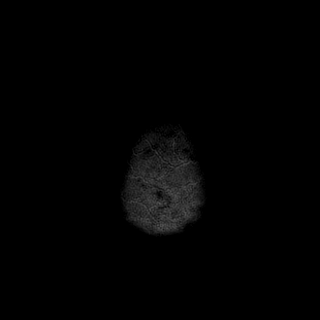

[Series 11: FLAIR · axial · 5.0mm · 0.45mm/px · z∈[-64,+80]mm · 2 of 25 slices shown]
[im 1/25]
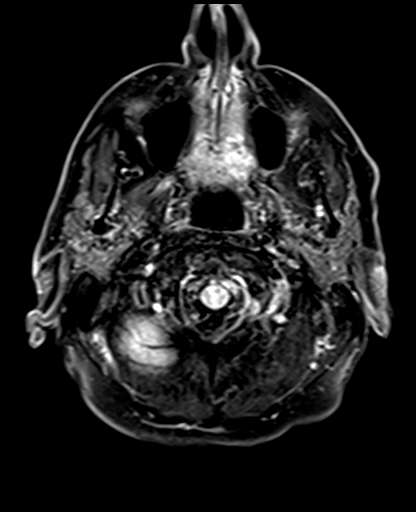
[im 25/25]
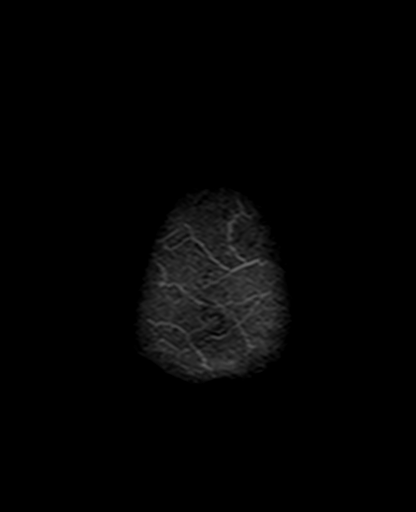

[Series 13: pha_images · axial · 3.0mm · 0.90mm/px · z∈[-81,+93]mm · 5 of 58 slices shown]
[im 1/58]
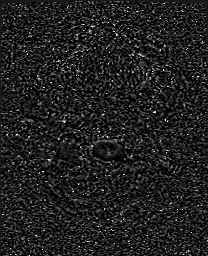
[im 15/58]
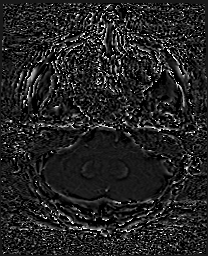
[im 29/58]
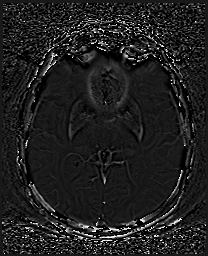
[im 43/58]
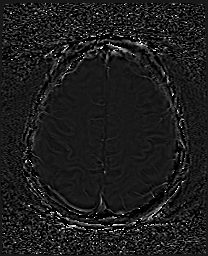
[im 58/58]
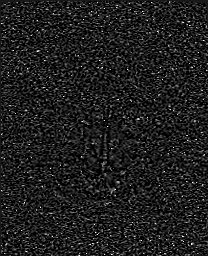

[Series 14: swi_images · axial · 3.0mm · 0.90mm/px · z∈[-81,+96]mm · 6 of 60 slices shown]
[im 1/60]
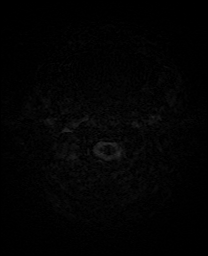
[im 12/60]
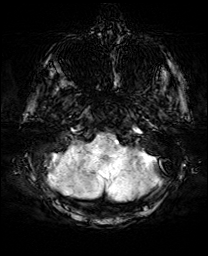
[im 24/60]
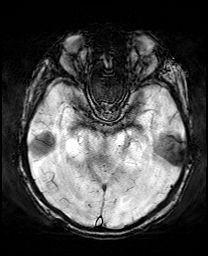
[im 36/60]
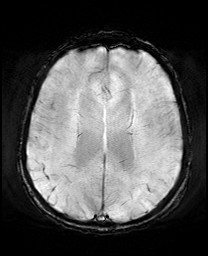
[im 48/60]
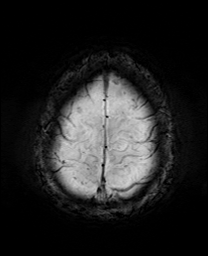
[im 60/60]
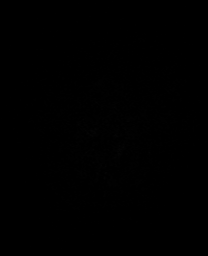

[Series 17: T2 · coronal · 5.0mm · 0.34mm/px · 3 of 29 slices shown (2 of 2)]
[im 1/29]
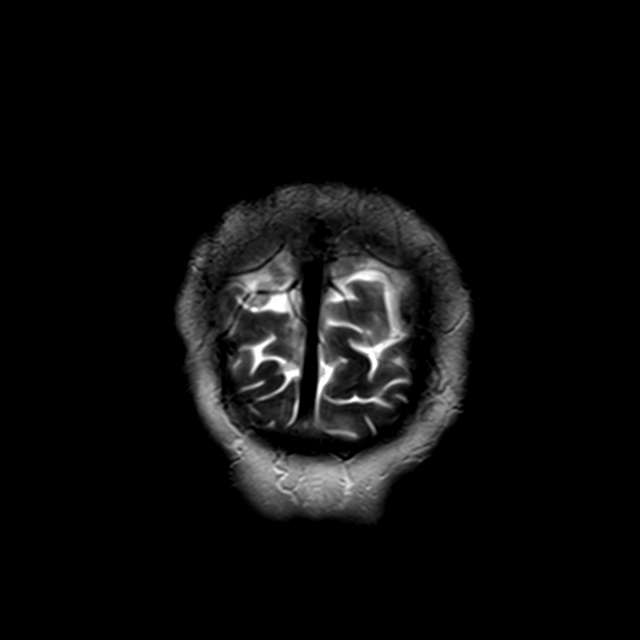
[im 15/29]
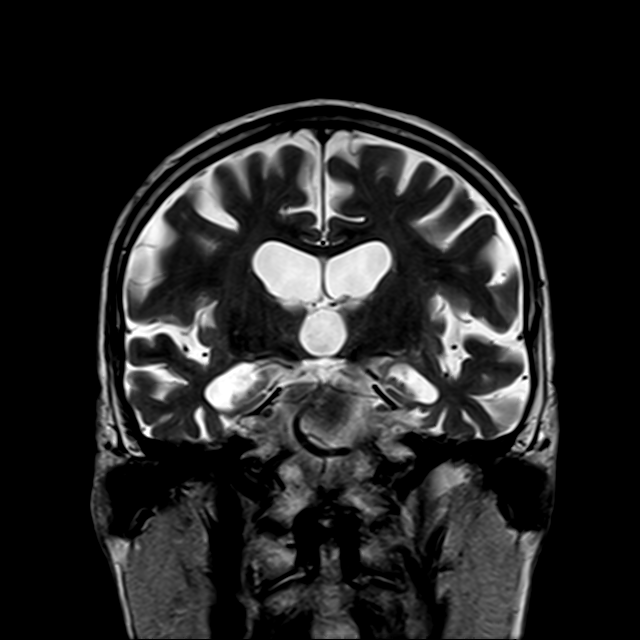
[im 29/29]
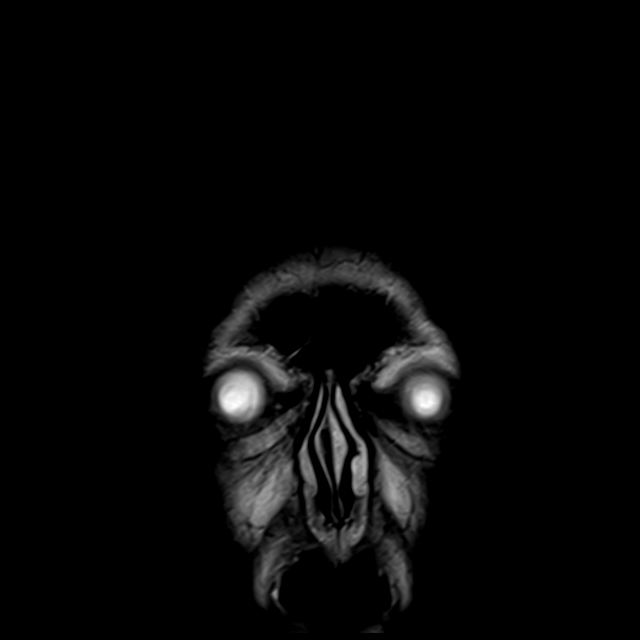

[42 of 48 positions shown; findings below may reference images not displayed]

FINDINGS: Brain: Generalized age-related cerebral volume loss. Mild patchy
T2/FLAIR hyperintensity involving the periventricular and deep white
matter both cerebral hemispheres most consistent with chronic small
vessel ischemic disease, minimal for age.

1.5 cm acute ischemic infarct present at the left thalamus. No
associated hemorrhage or mass effect. No other evidence for acute or
subacute ischemia. No other areas of chronic cortical infarction. No
acute or chronic intracranial blood products.

No mass lesion, midline shift or mass effect. No hydrocephalus or
extra-axial fluid collection. Pituitary gland suprasellar region
normal.

Vascular: Major intracranial vascular flow voids are maintained.

Skull and upper cervical spine: Craniocervical junction within
normal limits. Bone marrow signal intensity heterogeneous but
overall within normal limits. No scalp soft tissue abnormality.

Sinuses/Orbits: Prior bilateral ocular lens replacement. Paranasal
sinuses and mastoid air cells are clear.

Other: None.
IMPRESSION: 1. 1.5 cm acute ischemic nonhemorrhagic left thalamic infarct.
2. Underlying age-related cerebral volume loss with mild chronic
small vessel ischemic disease.

## 2021-11-01 IMAGING — CT CT HEAD W/O CM
4 series · 16 of 47 positions shown, 18 images · non-contrast
Comparison: MR head [DATE]

CLINICAL DATA: Numbness and tingling to right arm and right leg
since [REDACTED].



[Series 2: head wo · axial · 0.45mm/px · z∈[-151,-31]mm · 7 of 32 slices shown, 9 images]
[im 4/32  brain]
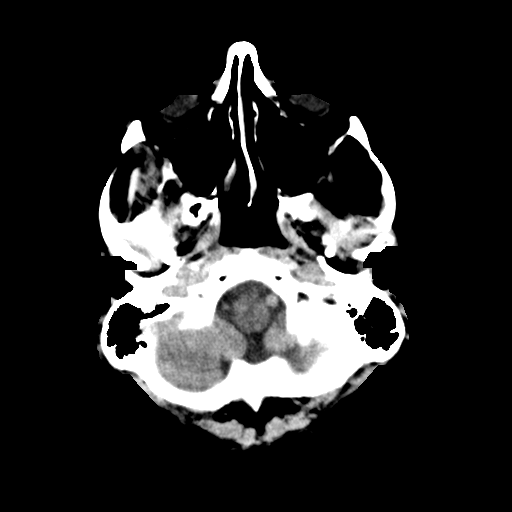
[im 4/32  bone]
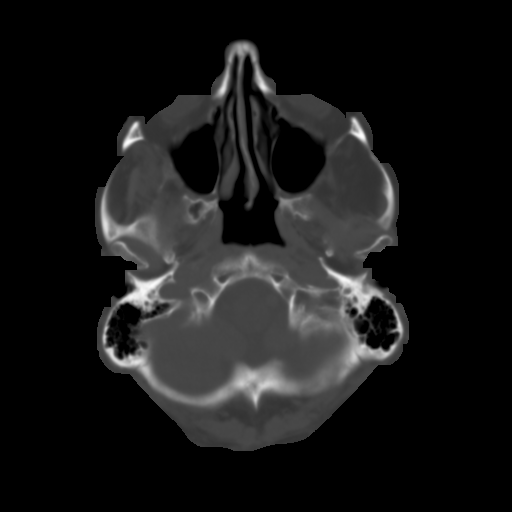
[im 8/32  brain]
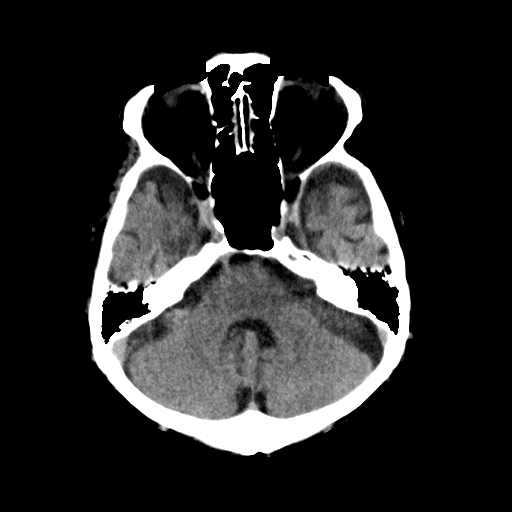
[im 12/32  brain]
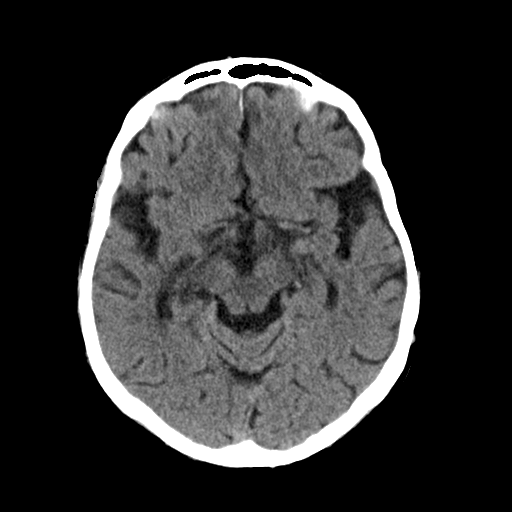
[im 16/32  brain]
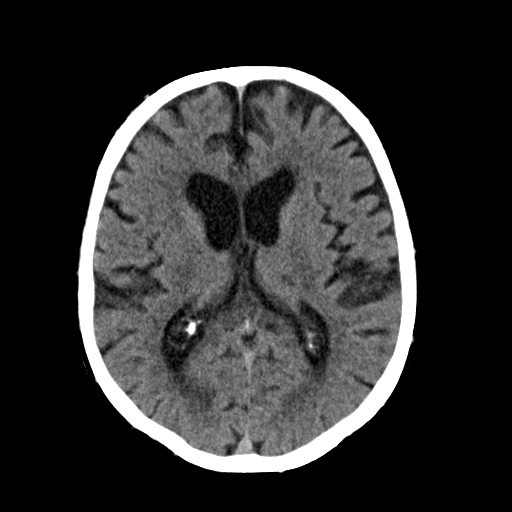
[im 20/32  brain]
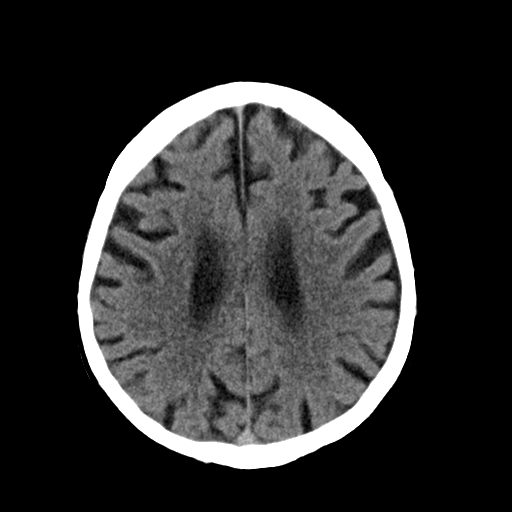
[im 20/32  bone]
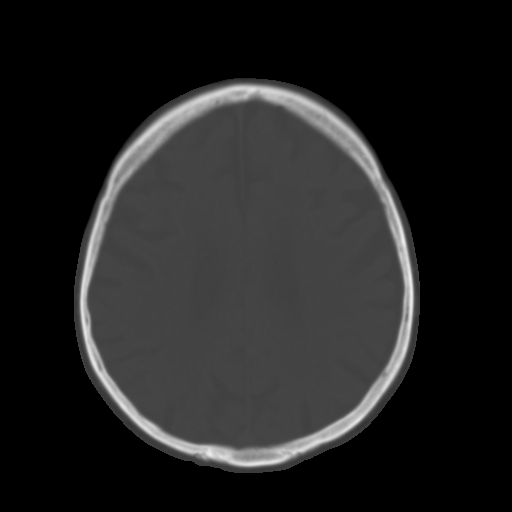
[im 24/32  brain]
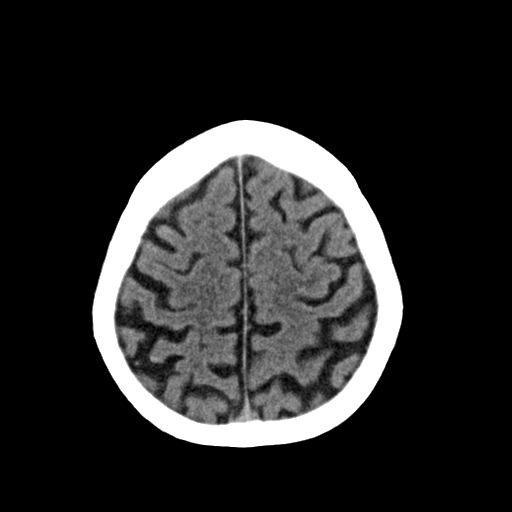
[im 28/32  brain]
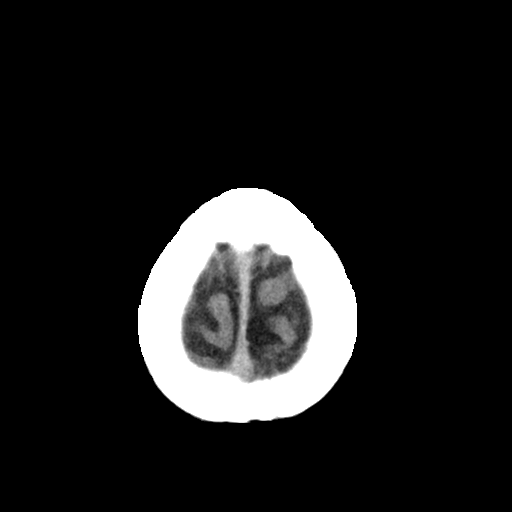

[Series 3: head bone · axial · 0.45mm/px · z∈[-152,-120]mm · 3 of 80 slices shown]
[im 8/80  bone]
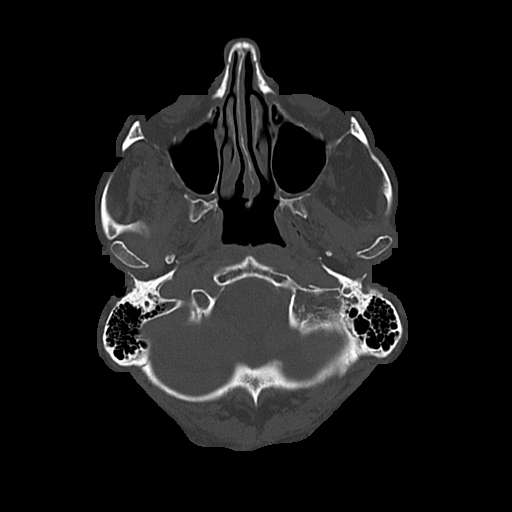
[im 16/80  bone]
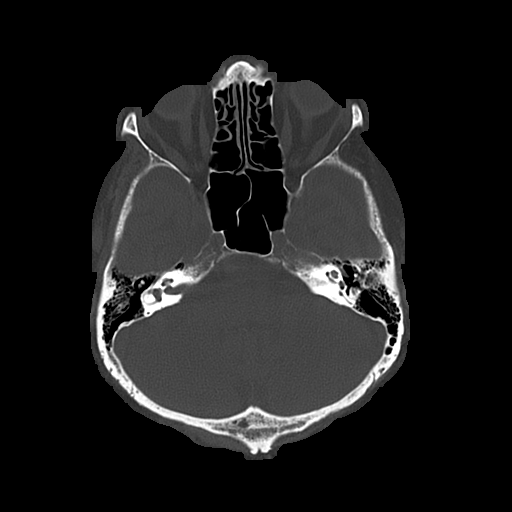
[im 24/80  bone]
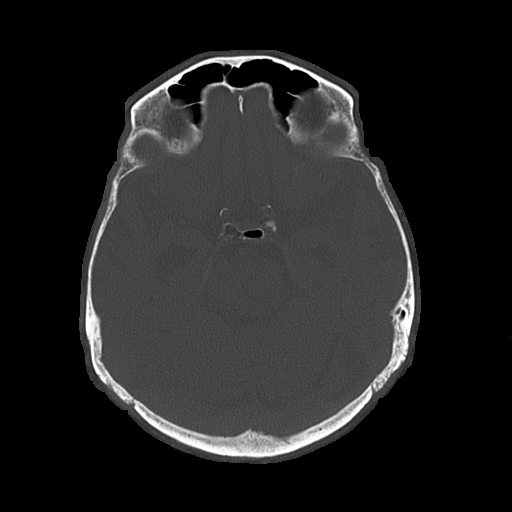

[Series 4: coronal soft · coronal · 0.32mm/px · 3 of 68 slices shown]
[im 23/68  brain]
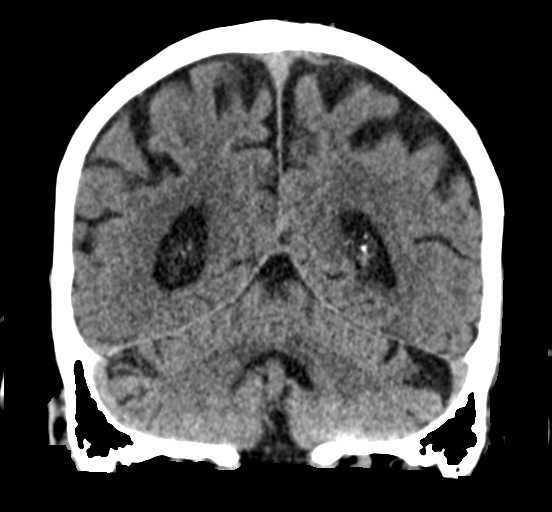
[im 30/68  brain]
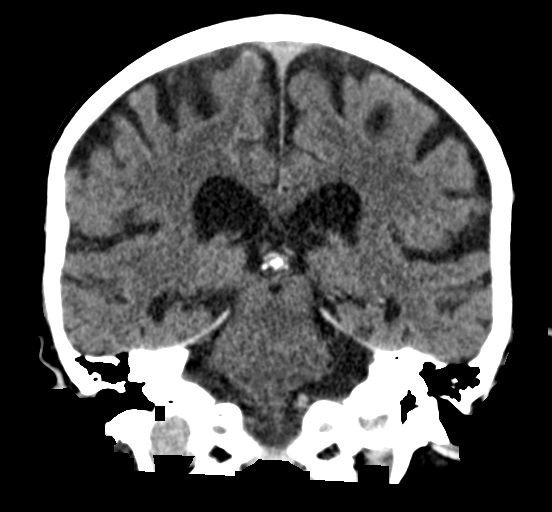
[im 38/68  brain]
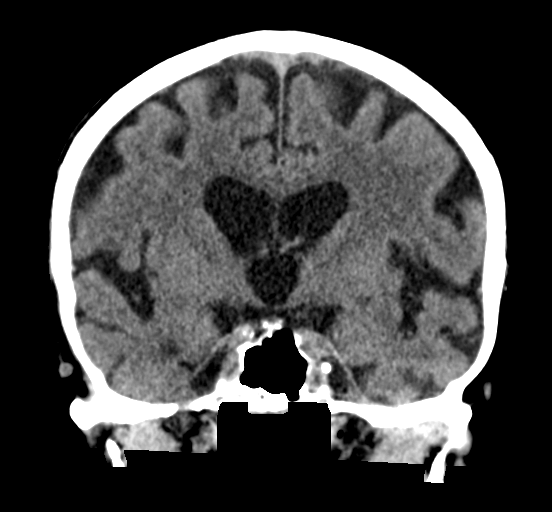

[Series 5: sagittal soft · sagittal · 0.31mm/px · 3 of 60 slices shown]
[im 20/60  brain]
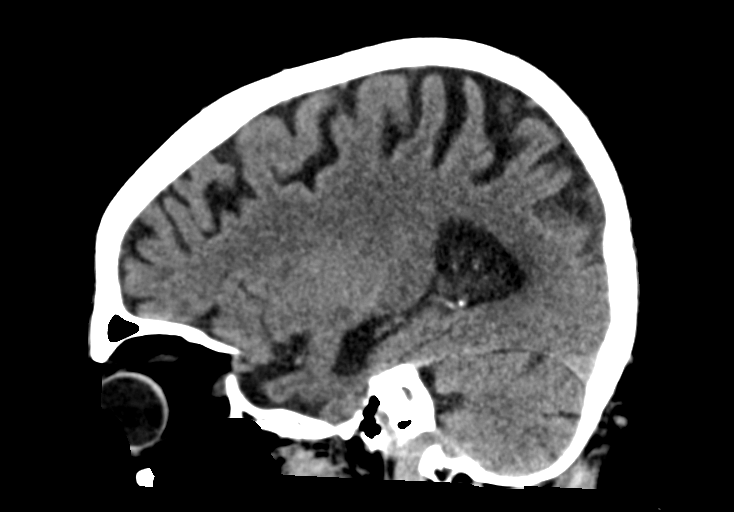
[im 30/60  brain]
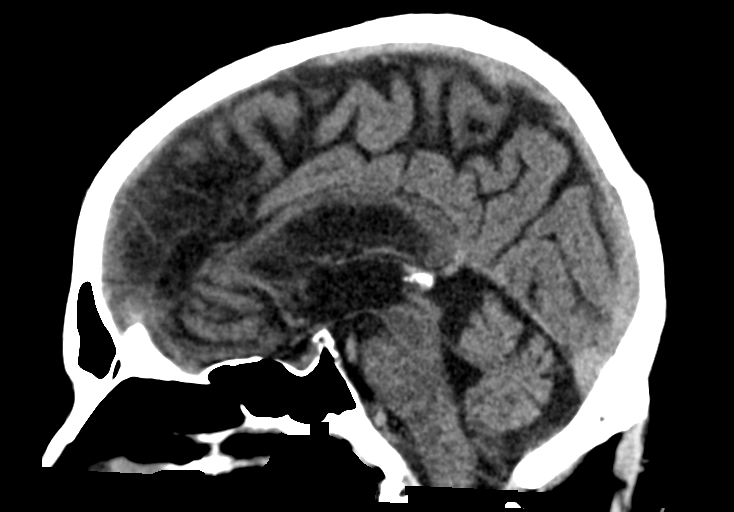
[im 40/60  brain]
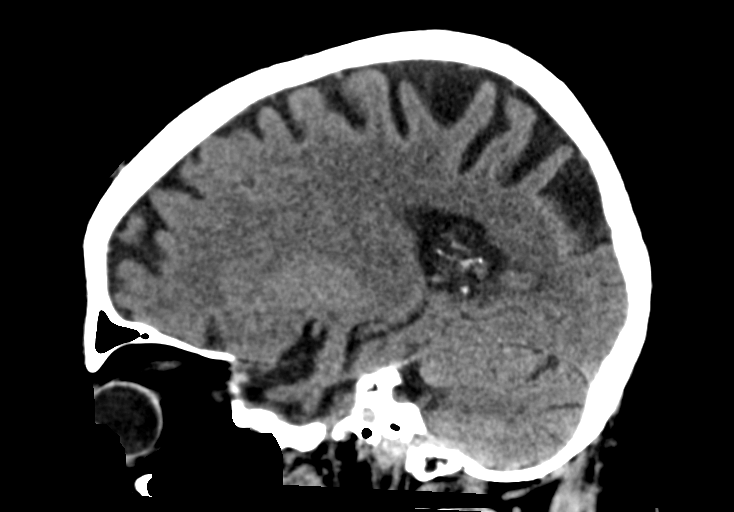

[16 of 47 positions shown; findings below may reference images not displayed]

FINDINGS: Brain: No evidence of acute infarction, hemorrhage, hydrocephalus,
extra-axial collection or mass lesion/mass effect. Generalized
moderate parenchymal volume loss with concordant dilatation of the
ventricles, similar to MR head [DATE].

Vascular: No hyperdense vessel or unexpected calcification.

Skull: Normal. Negative for fracture or focal lesion.

Sinuses/Orbits: No acute finding. Paranasal sinuses and mastoid air
cells are clear.

Other: None.
IMPRESSION: No acute intracranial abnormality.

Stable moderate generalized parenchymal volume loss.

## 2021-11-01 MED ORDER — CLOPIDOGREL BISULFATE 75 MG PO TABS
75.0000 mg | ORAL_TABLET | Freq: Every day | ORAL | Status: DC
  Administered 2021-11-02: 75 mg via ORAL
  Filled 2021-11-01: qty 1

## 2021-11-01 NOTE — ED Provider Notes (Signed)
?[facility] ?Provider Note ? ? ?CSN: [id number] ?Arrival date & time: 11/01/21  1508 ? ?  ? ?History ? ?Chief Complaint  ?Patient presents with  ? Tingling  ? ? ?Ronnie Gonzalez is a 81 y.o. male was received in transfer from [facility] for MRI of the brain with concern for acute stroke.  Patient's symptoms began 48 hours ago with right-sided tingling and reported weakness right arm while holding his coffee cup. ?Patient is accompanied by his daughter and his wife at the bedside. Per family his face is not droopy. He is on Aricept for dementia and is at his baseline per patient and family.  ? ?I personally reviewed this patient's medical records.  He is history of mild dementia and CVA in the past.  On baby aspirin and Aricept daily.  He is not anticoagulated. ? ?HPI ? ?  ? ?Home Medications ?Prior to Admission medications   ?Medication Sig Start Date End Date Taking? Authorizing Provider  ?Ascorbic Acid (VITAMIN C) 1000 MG tablet Take 1,000 mg by mouth daily.   Yes [provider]  ?aspirin EC 81 MG tablet Take 81 mg by mouth daily. Swallow whole.   Yes [provider]  ?Cholecalciferol (VITAMIN D3 SUPER STRENGTH) 50 MCG (2000 UT) CAPS Take 2,000 Units by mouth daily.   Yes [provider]  ?Cyanocobalamin (VITAMIN B-12) 2500 MCG SUBL Place 2,500 mcg under the tongue daily.   Yes [provider]  ?GLUCOSAMINE-CHONDROITIN PO Take 1 capsule by mouth daily.   Yes [provider]  ?Magnesium 250 MG TABS Take 250 mg by mouth daily.   Yes [provider]  ?memantine (NAMENDA) 5 MG tablet Take 1 tablet (5 mg at night) for 2 weeks, then increase to 1 tablet (5 mg) twice a day ?Patient taking differently: Take 5 mg by mouth 2 (two) times daily. 06/10/21  Yes Rondel Jumbo, PA-C  ?Multiple Vitamin (MULTIVITAMIN WITH MINERALS) TABS Take 1 tablet by mouth daily.   Yes [provider]  ?Omega-3 Fatty Acids (FISH OIL)  1000 MG CPDR Take 1,000 mg by mouth daily.   Yes [provider]  ?vitamin E 180 MG (400 UNITS) capsule Take 400 Units by mouth daily.   Yes [provider]  ?zinc gluconate 50 MG tablet Take 50 mg by mouth daily.   Yes [provider]  ?COVID-19 mRNA vaccine, Moderna, 100 MCG/0.5ML injection Inject into the muscle. ?Patient not taking: Reported on 05/08/2021 12/10/20   Carlyle Basques, MD  ?   ? ?Allergies    ?Other   ? ?Review of Systems   ?Review of Systems  ?Neurological:  Positive for weakness and numbness.  ?All other systems reviewed and are negative. ? ?Physical Exam ?Updated Vital Signs ?BP (!) 170/69 (BP Location: Left Arm)   Pulse 65   Temp 98 ?F (36.7 ?C)   Resp 16   Ht '5\' 10"'$  (1.778 m)   Wt 81.6 kg   SpO2 100%   BMI 25.83 kg/m?  ?Physical Exam ?Vitals and nursing note reviewed.  ?Constitutional:   ?   Appearance: He is not ill-appearing or toxic-appearing.  ?HENT:  ?   Head: Normocephalic and atraumatic.  ?   Nose: Nose normal.  ?   Mouth/Throat:  ?   Mouth: Mucous membranes are moist.  ?   Pharynx: No oropharyngeal exudate or posterior oropharyngeal erythema.  ?Eyes:  ?   General: Lids are normal. Vision grossly intact.     ?  Right eye: No discharge.     ?   Left eye: No discharge.  ?   Extraocular Movements: Extraocular movements intact.  ?   Conjunctiva/sclera: Conjunctivae normal.  ?   Pupils: Pupils are equal, round, and reactive to light.  ?   Visual Fields: Right eye visual fields normal and left eye visual fields normal.  ?Cardiovascular:  ?   Rate and Rhythm: Normal rate and regular rhythm.  ?   Pulses: Normal pulses.  ?   Heart sounds: Normal heart sounds.  ?Pulmonary:  ?   Effort: Pulmonary effort is normal. No respiratory distress.  ?   Breath sounds: Normal breath sounds. No wheezing or rales.  ?Abdominal:  ?   General: Bowel sounds are normal. There is no distension.  ?   Palpations: Abdomen is soft.  ?   Tenderness: There is no abdominal tenderness.   ?Musculoskeletal:     ?   General: No deformity.  ?   Cervical back: Neck supple.  ?   Right lower leg: No edema.  ?   Left lower leg: No edema.  ?Skin: ?   General: Skin is warm and dry.  ?   Capillary Refill: Capillary refill takes less than 2 seconds.  ?Neurological:  ?   General: No focal deficit present.  ?   Mental Status: He is alert and oriented to person, place, and time. Mental status is at baseline.  ?   GCS: GCS eye subscore is 4. GCS verbal subscore is 5. GCS motor subscore is 6.  ?   Cranial Nerves: Cranial nerves 2-12 are intact.  ?   Sensory: Sensation is intact.  ?   Motor: Motor function is intact.  ?   Coordination: Coordination is intact.  ?   Gait: Gait is intact.  ?Psychiatric:     ?   Mood and Affect: Mood normal.  ? ? ?ED Results / Procedures / Treatments   ?Labs ?(all labs ordered are listed, but only abnormal results are displayed) ?Labs Reviewed  ?BASIC METABOLIC PANEL  ?CBC  ?URINALYSIS, ROUTINE W REFLEX MICROSCOPIC  ?ETHANOL  ?PROTIME-INR  ?APTT  ?DIFFERENTIAL  ?RAPID URINE DRUG SCREEN, HOSP PERFORMED  ?CBG MONITORING, ED  ? ? ?EKG ?EKG Interpretation ? ?Date/Time:  Sunday November 01 2021 15:33:25 EDT ?Ventricular Rate:  69 ?PR Interval:  162 ?QRS Duration: 102 ?QT Interval:  426 ?QTC Calculation: 456 ?R Axis:   75 ?Text Interpretation: Normal sinus rhythm Normal ECG When compared with ECG of 03-Jan-2004 08:52, No significant change was found Confirmed by Malvin Johns [phone]) on 11/01/2021 3:40:17 PM ? ?Radiology ?CT HEAD WO CONTRAST ? ?Result Date: 11/01/2021 ?CLINICAL DATA:  Numbness and tingling to right arm and right leg since Friday. EXAM: CT HEAD WITHOUT CONTRAST TECHNIQUE: Contiguous axial images were obtained from the base of the skull through the vertex without intravenous contrast. RADIATION DOSE REDUCTION: This exam was performed according to the departmental dose-optimization program which includes automated exposure control, adjustment of the mA and/or kV according to patient  size and/or use of iterative reconstruction technique. COMPARISON:  MR head 05/09/2021 FINDINGS: Brain: No evidence of acute infarction, hemorrhage, hydrocephalus, extra-axial collection or mass lesion/mass effect. Generalized moderate parenchymal volume loss with concordant dilatation of the ventricles, similar to MR head 05/09/2021. Vascular: No hyperdense vessel or unexpected calcification. Skull: Normal. Negative for fracture or focal lesion. Sinuses/Orbits: No acute finding. Paranasal sinuses and mastoid air cells are clear. Other: None. IMPRESSION: No acute intracranial abnormality. Stable moderate  generalized parenchymal volume loss. Electronically Signed   By: Ileana Roup M.D.   On: 11/01/2021 16:46  ? ?MR BRAIN WO CONTRAST ? ?Result Date: 11/01/2021 ?CLINICAL DATA:  Initial evaluation for neuro deficit, stroke suspected. EXAM: MRI HEAD WITHOUT CONTRAST TECHNIQUE: Multiplanar, multiecho pulse sequences of the brain and surrounding structures were obtained without intravenous contrast. COMPARISON:  Prior CT from earlier the same day. FINDINGS: Brain: Generalized age-related cerebral volume loss. Mild patchy T2/FLAIR hyperintensity involving the periventricular and deep white matter both cerebral hemispheres most consistent with chronic small vessel ischemic disease, minimal for age. 1.5 cm acute ischemic infarct present at the left thalamus. No associated hemorrhage or mass effect. No other evidence for acute or subacute ischemia. No other areas of chronic cortical infarction. No acute or chronic intracranial blood products. No mass lesion, midline shift or mass effect. No hydrocephalus or extra-axial fluid collection. Pituitary gland suprasellar region normal. Vascular: Major intracranial vascular flow voids are maintained. Skull and upper cervical spine: Craniocervical junction within normal limits. Bone marrow signal intensity heterogeneous but overall within normal limits. No scalp soft tissue abnormality.  Sinuses/Orbits: Prior bilateral ocular lens replacement. Paranasal sinuses and mastoid air cells are clear. Other: None. IMPRESSION: 1. 1.5 cm acute ischemic nonhemorrhagic left thalamic infarct. 2. Underlying a

## 2021-11-01 NOTE — ED Notes (Signed)
Pt ambulated to restroom with even steady gait. ? ?

## 2021-11-01 NOTE — ED Provider Notes (Signed)
?[facility] ?Provider Note ? ? ?CSN: [id number] ?Arrival date & time: 11/01/21  1508 ? ?  ? ?History ? ?Chief Complaint  ?Patient presents with  ? Tingling  ? ? ?Ronnie Gonzalez is a 81 y.o. male. ? ?Patient is a 81 year old male who presents with right-sided numbness and weakness.  Reportedly the symptoms started 2 days ago on Friday.  He has a history of dementia so history is somewhat limited.  He is able to give some of the history and further history is obtained from the patient's wife and daughter.  He started complaining of some numbness and tingling in his right arm and right leg on Friday.  He also has had some weakness in the right arm and right leg.  He dropped a cup of coffee this morning and broke a cup.  He said his hand felt weak.  He seems to have some weakness in his right leg on walking and at times has to drag the leg.  He denies any vision changes.  No speech deficits or slurred speech.  No facial drooping.  No prior history of strokes.  His wife reports that he had a somewhat recent evaluation of his neck which revealed some narrowing of the arteries and was started on aspirin. ? ? ?  ? ?Home Medications ?Prior to Admission medications   ?Medication Sig Start Date End Date Taking? Authorizing Provider  ?COVID-19 mRNA vaccine, Moderna, 100 MCG/0.5ML injection Inject into the muscle. ?Patient not taking: Reported on 05/08/2021 12/10/20   Carlyle Basques, MD  ?memantine [facility]) 5 MG tablet Take 1 tablet (5 mg at night) for 2 weeks, then increase to 1 tablet (5 mg) twice a day 06/10/21   Rondel Jumbo, PA-C  ?Multiple Vitamin (MULTIVITAMIN WITH MINERALS) TABS Take 1 tablet by mouth daily.    [provider]  ?omega-3 acid ethyl esters (LOVAZA) 1 G capsule Take 1 g by mouth daily.    [provider]  ?tiZANidine (ZANAFLEX) 4 MG tablet Take 4 mg by mouth 3 (three) times daily as needed. ?Patient not taking: Reported on 05/08/2021 11/12/20   [provider]  ?   ? ?Allergies    ?Other   ? ?Review of Systems   ?Review of Systems  ?Constitutional:  Negative for chills, diaphoresis, fatigue and fever.  ?HENT:  Negative for congestion, rhinorrhea and sneezing.   ?Eyes: Negative.   ?Respiratory:  Negative for cough, chest tightness and shortness of breath.   ?Cardiovascular:  Negative for chest pain and leg swelling.  ?Gastrointestinal:  Negative for abdominal pain, blood in stool, diarrhea, nausea and vomiting.  ?Genitourinary:  Negative for difficulty urinating, flank pain, frequency and hematuria.  ?Musculoskeletal:  Negative for arthralgias and back pain.  ?Skin:  Negative for rash.  ?Neurological:  Positive for weakness and numbness. Negative for dizziness, speech difficulty and headaches.  ? ?Physical Exam ?Updated Vital Signs ?BP (!) 141/71   Pulse 62   Temp 98 ?F (36.7 ?C)   Resp 20   Ht '5\' 10"'$  (1.778 m)   Wt 81.6 kg   SpO2 97%   BMI 25.83 kg/m?  ?Physical Exam ?Constitutional:   ?   Appearance: He is well-developed.  ?HENT:  ?   Head: Normocephalic and atraumatic.  ?Eyes:  ?   Pupils: Pupils are equal, round, and reactive to light.  ?Cardiovascular:  ?   Rate and Rhythm: Normal rate and regular rhythm.  ?   Heart sounds: Normal heart  sounds.  ?Pulmonary:  ?   Effort: Pulmonary effort is normal. No respiratory distress.  ?   Breath sounds: Normal breath sounds. No wheezing or rales.  ?Chest:  ?   Chest wall: No tenderness.  ?Abdominal:  ?   General: Bowel sounds are normal.  ?   Palpations: Abdomen is soft.  ?   Tenderness: There is no abdominal tenderness. There is no guarding or rebound.  ?Musculoskeletal:     ?   General: Normal range of motion.  ?   Cervical back: Normal range of motion and neck supple.  ?Lymphadenopathy:  ?   Cervical: No cervical adenopathy.  ?Skin: ?   General: Skin is warm and dry.  ?   Findings: No rash.  ?Neurological:  ?   Mental Status: He is alert and oriented to person, place, and time.  ?   Comments: Patient is  oriented to person place and time.  He does have normal sensation in the upper extremities bilaterally.  He has some somewhat decreased sensation to light touch in the right lower extremity as compared to the left.  He has 5 out of 5 strength in all extremities, cranial nerves II through XII grossly intact, finger-nose intact, no pronator drift  ? ? ?ED Results / Procedures / Treatments   ?Labs ?(all labs ordered are listed, but only abnormal results are displayed) ?Labs Reviewed  ?BASIC METABOLIC PANEL  ?CBC  ?URINALYSIS, ROUTINE W REFLEX MICROSCOPIC  ?ETHANOL  ?PROTIME-INR  ?APTT  ?DIFFERENTIAL  ?RAPID URINE DRUG SCREEN, HOSP PERFORMED  ?CBG MONITORING, ED  ? ? ?EKG ?EKG Interpretation ? ?Date/Time:  Sunday November 01 2021 15:33:25 EDT ?Ventricular Rate:  69 ?PR Interval:  162 ?QRS Duration: 102 ?QT Interval:  426 ?QTC Calculation: 456 ?R Axis:   75 ?Text Interpretation: Normal sinus rhythm Normal ECG When compared with ECG of 03-Jan-2004 08:52, No significant change was found Confirmed by Malvin Johns [phone]) on 11/01/2021 3:40:17 PM ? ?Radiology ?CT HEAD WO CONTRAST ? ?Result Date: 11/01/2021 ?CLINICAL DATA:  Numbness and tingling to right arm and right leg since Friday. EXAM: CT HEAD WITHOUT CONTRAST TECHNIQUE: Contiguous axial images were obtained from the base of the skull through the vertex without intravenous contrast. RADIATION DOSE REDUCTION: This exam was performed according to the departmental dose-optimization program which includes automated exposure control, adjustment of the mA and/or kV according to patient size and/or use of iterative reconstruction technique. COMPARISON:  MR head 05/09/2021 FINDINGS: Brain: No evidence of acute infarction, hemorrhage, hydrocephalus, extra-axial collection or mass lesion/mass effect. Generalized moderate parenchymal volume loss with concordant dilatation of the ventricles, similar to MR head 05/09/2021. Vascular: No hyperdense vessel or unexpected calcification.  Skull: Normal. Negative for fracture or focal lesion. Sinuses/Orbits: No acute finding. Paranasal sinuses and mastoid air cells are clear. Other: None. IMPRESSION: No acute intracranial abnormality. Stable moderate generalized parenchymal volume loss. Electronically Signed   By: Ileana Roup M.D.   On: 11/01/2021 16:46   ? ?Procedures ?Procedures  ? ? ?Medications Ordered in ED ?Medications - No data to display ? ?ED Course/ Medical Decision Making/ A&P ?  ?                        ?Medical Decision Making ?Amount and/or Complexity of Data Reviewed ?Independent Historian: spouse ?External Data Reviewed: notes. ?Labs: ordered. Decision-making details documented in ED Course. ?Radiology: ordered. Decision-making details documented in ED Course. ?ECG/medicine tests: ordered and independent interpretation performed. Decision-making details  documented in ED Course. ? ?Risk ?Decision regarding hospitalization. ? ? ?Patient is a 81 year old male who presents with a 2-day history of some numbness in his right arm and right leg as well as some weakness.  He was apparently dropping things with his hands and was having more difficulty walking.  He seems to have good strength on my exam.  There is some subjective numbness in the leg but not the arm.  No facial drooping or slurred speech.  His head CT shows no acute abnormality.  His labs are nonconcerning.  His symptoms are concerning for a possible CVA.  Feel that patient needs an MRI.  I discussed this with the patient and the family.  We will plan to transfer to [facility] ED to obtain an MRI.  This was ordered by me.  Dr. Almyra Free is excepting the patient. ? ?Final Clinical Impression(s) / ED Diagnoses ?Final diagnoses:  ?Right sided weakness  ? ? ?Rx / DC Orders ?ED Discharge Orders   ? ? None  ? ?  ? ? ?  ?Malvin Johns, MD ?11/01/21 1807 ? ?

## 2021-11-01 NOTE — ED Triage Notes (Signed)
Patient reports to the ER for numbness and tingling in the right arm and right leg since Friday. Patient reports that walking up steps he has been actively trying to move the legs. Patient has had weakness in the right arm holding cups of coffee and there seems to be an unevenness with smiling but his sensation is the same and his cognition is the same. Patient is on Aricept and his daughter reports he is at baseline.  ?

## 2021-11-01 NOTE — Consult Note (Addendum)
NEUROLOGY CONSULTATION NOTE  ? ?Date of service: November 01, 2021 ?Patient Name: Ronnie Gonzalez ?MRN:  [id number] ?DOB:  [birth year] ?Reason for consult: "L thalamic stroke" ?Requesting Provider: Jeanell Sparrow, DO ?_ _ _   _ __   _ __ _ _  __ __   _ __   __ _ ? ?History of Present Illness  ?Ronnie Gonzalez is a 81 y.o. male with PMH significant for dementia who presents with tingling on the Right alon with R sided clumsiness. He is poor historian and provide inaccurate history secondary to dementia and reports symptoms ongoing for a month. Most of the hx is obtained from his wife over phone. ? ?Per wife, went golfing on thursday and was upset that his swing was very bad. On Sunday, dropped a cup of coffee, dropped a plate later in the day. He felt like his R side is lame. Yesterday AM, he was driving them to church and his driving was not smooth. This is very unusual for him  ? ?No prior hx of strokes, no hx of DM2, HTN, HLD. Was a former smoker. Drinks 1-2 beers a night. ? ?LKW: per patient, about a month ago ?mRS: 0 ?tNKASE: not offered, outside the window ?Thrombectomy: not offered, los suspicion for LVO. ?NIHSS components Score: Comment  ?1a Level of Conscious 0'[x]'$  1'[]'$  2'[]'$  3'[]'$      ?1b LOC Questions 0'[x]'$  1'[]'$  2'[]'$       ?1c LOC Commands 0'[x]'$  1'[]'$  2'[]'$       ?2 Best Gaze 0'[x]'$  1'[]'$  2'[]'$       ?3 Visual 0'[x]'$  1'[]'$  2'[]'$  3'[]'$      ?4 Facial Palsy 0'[x]'$  1'[]'$  2'[]'$  3'[]'$      ?5a Motor Arm - left 0'[x]'$  1'[]'$  2'[]'$  3'[]'$  4'[]'$  UN'[]'$    ?5b Motor Arm - Right 0'[x]'$  1'[]'$  2'[]'$  3'[]'$  4'[]'$  UN'[]'$    ?6a Motor Leg - Left 0'[x]'$  1'[]'$  2'[]'$  3'[]'$  4'[]'$  UN'[]'$    ?6b Motor Leg - Right 0'[x]'$  1'[]'$  2'[]'$  3'[]'$  4'[]'$  UN'[]'$    ?7 Limb Ataxia 0'[x]'$  1'[]'$  2'[]'$  3'[]'$  UN'[]'$     ?8 Sensory 0'[x]'$  1'[]'$  2'[]'$  UN'[]'$      ?9 Best Language 0'[x]'$  1'[]'$  2'[]'$  3'[]'$      ?10 Dysarthria 0'[x]'$  1'[]'$  2'[]'$  UN'[]'$      ?11 Extinct. and Inattention 0'[x]'$  1'[]'$  2'[]'$       ?TOTAL: 0   ?  ?ROS  ? ?Constitutional Denies weight loss, fever and chills.   ?HEENT Denies changes in vision and hearing.   ?Respiratory Denies SOB and cough.   ?CV Denies  palpitations and CP   ?GI Denies abdominal pain, nausea, vomiting and diarrhea.   ?GU Denies dysuria and urinary frequency.   ?MSK Denies myalgia and joint pain.   ?Skin Denies rash and pruritus.   ?Neurological Denies headache and syncope.   ?Psychiatric Denies recent changes in mood. Denies anxiety and depression.   ? ?Past History  ? ?Past Medical History:  ?Diagnosis Date  ? Diverticular disease   ? Mild dementia ([place])   ? ?Past Surgical History:  ?Procedure Laterality Date  ? COLON SURGERY    ? TONSILLECTOMY    ? ?History reviewed. No pertinent family history. ?Social History  ? ?Socioeconomic History  ? Marital status: Married  ?  Spouse name: Not on file  ? Number of children: 4  ? Years of education: 71  ? Highest education level: Not on file  ?Occupational History  ? Not on file  ?Tobacco Use  ? Smoking status: Former  ?  Types: Cigarettes  ?  Quit date: 10/24/1972  ?  Years since quitting: 49.0  ? Smokeless tobacco: Never  ?Vaping Use  ? Vaping Use: Never used  ?Substance and Sexual Activity  ? Alcohol use: Yes  ?  Alcohol/week: 10.0 standard drinks  ?  Types: 10 Cans of beer per week  ? Drug use: No  ? Sexual activity: Yes  ?Other Topics Concern  ? Not on file  ?Social History Narrative  ? Right handed  ? Two story home  ? Drinks caffeine  ? ?Social Determinants of Health  ? ?Financial Resource Strain: Not on file  ?Food Insecurity: Not on file  ?Transportation Needs: Not on file  ?Physical Activity: Not on file  ?Stress: Not on file  ?Social Connections: Not on file  ? ?Allergies  ?Allergen Reactions  ? Other   ? ? ?Medications  ?(Not in a hospital admission) ?  ? ?Vitals  ? ?Vitals:  ? 11/01/21 1715 11/01/21 1745 11/01/21 1830 11/01/21 1918  ?BP: (!) 142/67 (!) 141/71 (!) 152/65 (!) 170/69  ?Pulse: 63 62 62 65  ?Resp: '18 20 19 16  '$ ?Temp:      ?SpO2: 100% 97% 99% 100%  ?Weight:      ?Height:      ?  ? ?Body mass index is 25.83 kg/m?. ? ?Physical Exam  ? ?General: Laying comfortably in bed; in no acute  distress.  ?HENT: Normal oropharynx and mucosa. Normal external appearance of ears and nose.  ?Neck: Supple, no pain or tenderness  ?CV: No JVD. No peripheral edema.  ?Pulmonary: Symmetric Chest rise. Normal respiratory effort.  ?Abdomen: Soft to touch, non-tender.  ?Ext: No cyanosis, edema, or deformity  ?Skin: No rash. Normal palpation of skin.   ?Musculoskeletal: Normal digits and nails by inspection. No clubbing.  ? ?Neurologic Examination  ?Mental status/Cognition: Alert, oriented to self, place, month and year, good attention.  ?Speech/language: Fluent, comprehension intact, object naming intact, repetition intact.  ?Cranial nerves:  ? CN II Pupils equal and reactive to light, no VF deficits   ? CN III,IV,VI EOM intact, no gaze preference or deviation, no nystagmus   ? CN V normal sensation in V1, V2, and V3 segments bilaterally   ? CN VII no asymmetry, no nasolabial fold flattening   ? CN VIII normal hearing to speech   ? CN IX & X normal palatal elevation, no uvular deviation   ? CN XI 5/5 head turn and 5/5 shoulder shrug bilaterally   ? CN XII midline tongue protrusion   ? ?Motor:  ?Muscle bulk: normal, tone normal, pronator drift none tremor none ?Mvmt Root Nerve  Muscle Right Left Comments  ?SA C5/6 Ax Deltoid 5 5   ?EF C5/6 Mc Biceps 5 5   ?EE C6/7/8 Rad Triceps 5 5   ?WF C6/7 Med FCR     ?WE C7/8 PIN ECU     ?F Ab C8/T1 U ADM/FDI 5 5   ?HF L1/2/3 Fem Illopsoas 5 5   ?KE L2/3/4 Fem Quad 5 5   ?DF L4/5 D Peron Tib Ant 5 5   ?PF S1/2 Tibial Grc/Sol 5 5   ? ?Reflexes: ? Right Left Comments  ?Pectoralis     ? Biceps (C5/6) 2 2   ?Brachioradialis (C5/6) 2 2   ? Triceps (C6/7) 2 2   ? Patellar (L3/4) 2 2   ? Achilles (S1)     ? Hoffman     ? Plantar     ?  Jaw jerk   ? ?Sensation: ? Light touch Intact throughout  ? Pin prick   ? Temperature   ? Vibration   ?Proprioception   ? ?Coordination/Complex Motor:  ?- Finger to Nose intact BL ?- Heel to shin intact BL ?- Rapid alternating movement are normal ?- Gait:  Deferred. ? ?Labs  ? ?CBC:  ?Recent Labs  ?Lab 11/01/21 ?1538 11/01/21 ?1608  ?WBC 5.3  --   ?NEUTROABS  --  3.0  ?HGB 13.6  --   ?HCT 41.4  --   ?MCV 95.2  --   ?PLT 204  --   ? ? ?Basic Metabolic Panel:  ?Lab Results  ?Component Value Date  ? NA 140 11/01/2021  ? K 3.8 11/01/2021  ? CO2 26 11/01/2021  ? GLUCOSE 87 11/01/2021  ? BUN 14 11/01/2021  ? CREATININE 0.66 11/01/2021  ? CALCIUM 9.4 11/01/2021  ? GFRNONAA >60 11/01/2021  ? ?Lipid Panel: No results found for: [place] ?HgbA1c: No results found for: HGBA1C ?Urine Drug Screen:  ?   ?Component Value Date/Time  ? LABOPIA NONE DETECTED 11/01/2021 1608  ? COCAINSCRNUR NONE DETECTED 11/01/2021 1608  ? LABBENZ NONE DETECTED 11/01/2021 1608  ? AMPHETMU NONE DETECTED 11/01/2021 1608  ? THCU NONE DETECTED 11/01/2021 1608  ? LABBARB NONE DETECTED 11/01/2021 1608  ?  ?Alcohol Level  ?   ?Component Value Date/Time  ? ETH <10 11/01/2021 1608  ? ?CT angio head and neck: ?pending ? ?MRI Brain(Personally reviewed): ?1. 1.5 cm acute ischemic nonhemorrhagic left thalamic infarct. ?2. Underlying age-related cerebral volume loss with mild chronic ?small vessel ischemic disease. ? ? ?Impression  ? ?Ronnie Gonzalez is a 81 y.o. male with PMH significant for dementia who presents with  tingling on the Right. Started about a month ago, worsened a couple days ago. Dementia limits history, no family at bedside. His neurologic examination is notable for no focal deficit. MRI brain demonstrated a left thalamic stroke which likely is a small vessel stroke. ? ?Primary Diagnosis:  ?Other cerebral infarction due to occlusion of stenosis of small artery. ? ?Recommendations  ?Plan: ? ?Recommend that primary team order following: ?- Frequent Neuro checks per stroke unit protocol ?- Recommend brain imaging with MRI Brain without contrast ?- Recommend Vascular imaging with CT Angio head and neck ?- Recommend obtaining TTE ?- Recommend obtaining Lipid panel with LDL ?- Please start statin if LDL  > 70 ?- Recommend HbA1c ?- Antithrombotic - Aspirin '81mg'$  daily along with plavix '75mg'$  daily x 21 days followed by Aspirin '81mg'$  daily alone. Took aspirin tonight. ?- Recommend DVT ppx ?- SBP goal - permissive hyper

## 2021-11-02 ENCOUNTER — Inpatient Hospital Stay (HOSPITAL_BASED_OUTPATIENT_CLINIC_OR_DEPARTMENT_OTHER): Payer: Medicare Other

## 2021-11-02 ENCOUNTER — Encounter (HOSPITAL_COMMUNITY): Payer: Self-pay | Admitting: Internal Medicine

## 2021-11-02 ENCOUNTER — Inpatient Hospital Stay (HOSPITAL_COMMUNITY): Payer: Medicare Other

## 2021-11-02 DIAGNOSIS — I639 Cerebral infarction, unspecified: Secondary | ICD-10-CM | POA: Diagnosis not present

## 2021-11-02 DIAGNOSIS — M47812 Spondylosis without myelopathy or radiculopathy, cervical region: Secondary | ICD-10-CM | POA: Diagnosis not present

## 2021-11-02 DIAGNOSIS — F0390 Unspecified dementia without behavioral disturbance: Secondary | ICD-10-CM

## 2021-11-02 DIAGNOSIS — I63233 Cerebral infarction due to unspecified occlusion or stenosis of bilateral carotid arteries: Secondary | ICD-10-CM | POA: Diagnosis not present

## 2021-11-02 DIAGNOSIS — F1010 Alcohol abuse, uncomplicated: Secondary | ICD-10-CM

## 2021-11-02 DIAGNOSIS — I672 Cerebral atherosclerosis: Secondary | ICD-10-CM | POA: Diagnosis not present

## 2021-11-02 DIAGNOSIS — Z789 Other specified health status: Secondary | ICD-10-CM

## 2021-11-02 DIAGNOSIS — I6389 Other cerebral infarction: Secondary | ICD-10-CM

## 2021-11-02 LAB — LIPID PANEL
Cholesterol: 139 mg/dL (ref 0–200)
HDL: 35 mg/dL — ABNORMAL LOW (ref >40)
LDL Cholesterol: 88 mg/dL (ref 0–99)
Total CHOL/HDL Ratio: 4 RATIO
Triglycerides: 79 mg/dL (ref <150)
VLDL: 16 mg/dL (ref 0–40)

## 2021-11-02 LAB — ECHOCARDIOGRAM COMPLETE
AR max vel: 4.09 cm2
AV Area VTI: 4.1 cm2
AV Area mean vel: 3.96 cm2
AV Mean grad: 2 mmHg
AV Peak grad: 4.5 mmHg
Ao pk vel: 1.06 m/s
Area-P 1/2: 3.48 cm2
Calc EF: 46.4 %
Height: 70 in
S' Lateral: 3.7 cm
Single Plane A2C EF: 52.7 %
Single Plane A4C EF: 45.3 %
Weight: 2880 oz

## 2021-11-02 LAB — HEMOGLOBIN A1C
Hgb A1c MFr Bld: 5 % (ref 4.8–5.6)
Mean Plasma Glucose: 97 mg/dL

## 2021-11-02 IMAGING — CT CT ANGIO HEAD-NECK (W OR W/O PERF)
1 of 11 series · 5 of 33 positions shown · IV contrast (APPLIED)
Comparison: Prior CT and MRI from [DATE].

CLINICAL DATA: Follow-up examination for acute stroke.

EXAM:
CT ANGIOGRAPHY HEAD AND NECK
TECHNIQUE: Multidetector CT imaging of the head and neck was performed using
the standard protocol during bolus administration of intravenous
contrast. Multiplanar CT image reconstructions and MIPs were
obtained to evaluate the vascular anatomy. Carotid stenosis
measurements (when applicable) are obtained utilizing NASCET
criteria, using the distal internal carotid diameter as the
denominator.

[Series 11: ax thins · axial · 0.39mm/px · z∈[-380,-126]mm · 5 of 396 slices shown]
[im 66/396  soft-tissue]
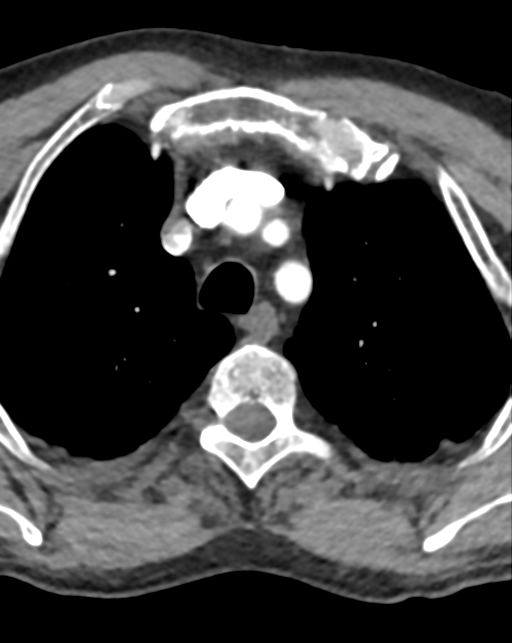
[im 132/396  bone]
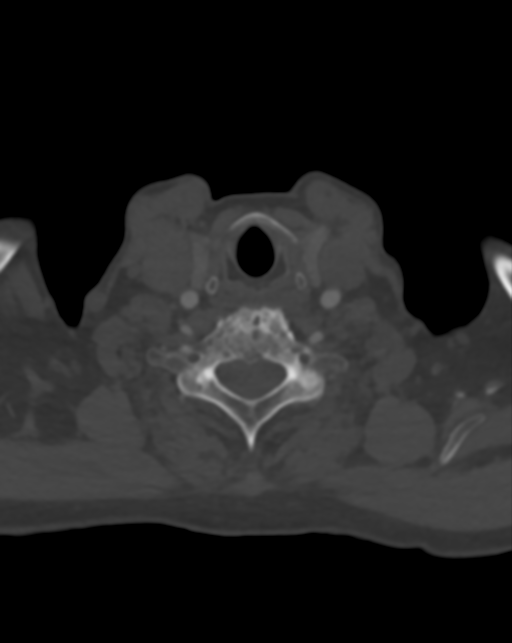
[im 198/396  soft-tissue]
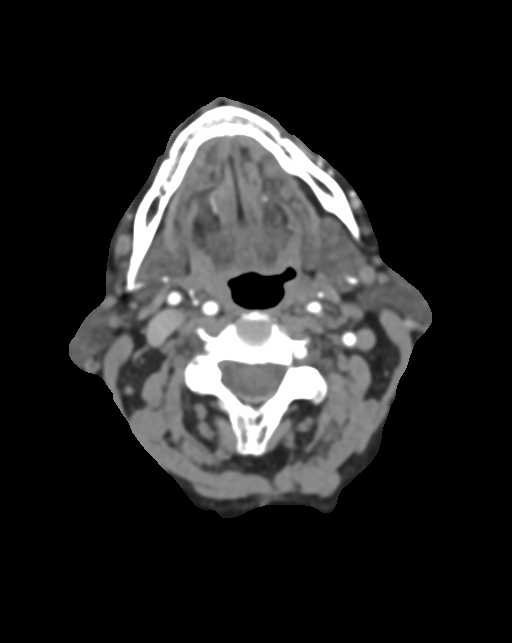
[im 264/396  bone]
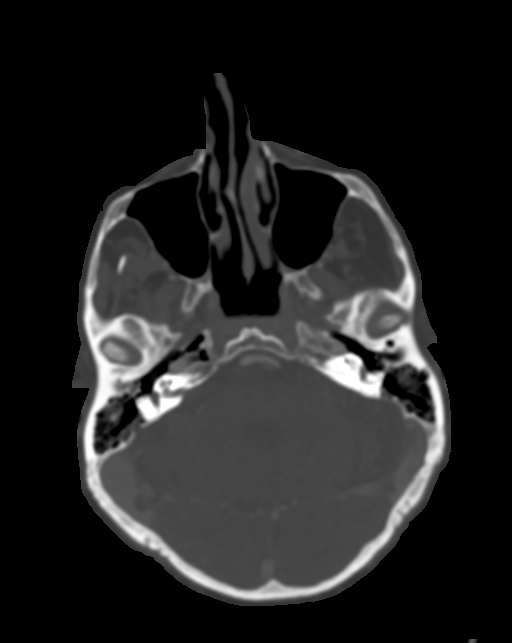
[im 330/396  soft-tissue]
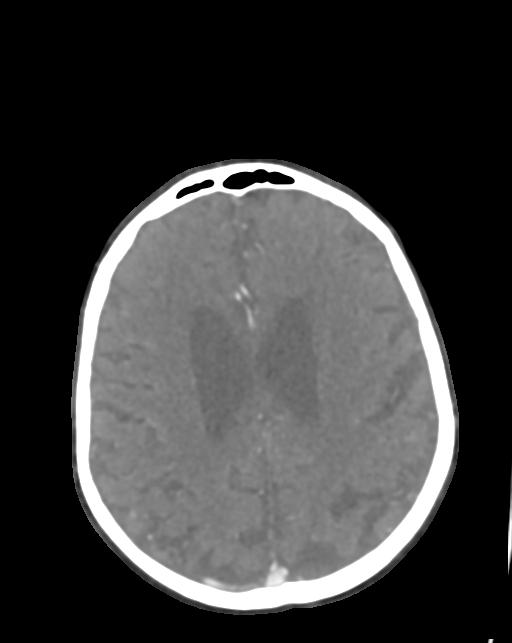

[5 of 33 positions shown; findings below may reference images not displayed]

RADIATION DOSE REDUCTION: This exam was performed according to the
departmental dose-optimization program which includes automated
exposure control, adjustment of the mA and/or kV according to
patient size and/or use of iterative reconstruction technique.

CONTRAST:  100mL OMNIPAQUE IOHEXOL 350 MG/ML SOLN
FINDINGS: CT HEAD FINDINGS

Brain: Age-related cerebral atrophy with mild chronic small vessel
ischemic disease. Previously identified acute left thalamic infarct
again noted, stable. No other large vessel territory infarct. No
intracranial hemorrhage. No mass lesion or midline shift. No
hydrocephalus or extra-axial fluid collection.

Vascular: No hyperdense vessel.

Skull: Scalp soft tissues and calvarium within normal limits.

Sinuses: Clear.

Orbits: Unremarkable.

Review of the MIP images confirms the above findings

CTA NECK FINDINGS

Aortic arch: Visualized aortic arch normal caliber with normal
branch pattern. Mild plaque about the arch itself. No significant
stenosis about the origin the great vessels.

Right carotid system: Right common and internal carotid arteries
patent without significant stenosis or dissection. Mild for age
atheromatous change about the right carotid bulb without stenosis.

Left carotid system: Left common and internal carotid arteries
patent without significant stenosis or dissection. Mild atheromatous
change about the left carotid bulb without significant stenosis.

Vertebral arteries: Both vertebral arteries arise from subclavian
arteries. No proximal subclavian artery stenosis. Left vertebral
artery dominant. Vertebral arteries patent without stenosis or
dissection.

Skeleton: No discrete or worrisome osseous lesions. Moderate
cervical spondylosis without high-grade stenosis. Patient is
edentulous.

Other neck: No other acute soft tissue abnormality within the neck.

Upper chest: Visualized upper chest demonstrates no acute finding.

Review of the MIP images confirms the above findings

CTA HEAD FINDINGS

Anterior circulation: Petrous segments patent bilaterally. Mild for
age atheromatous change within the carotid siphons without
significant stenosis. A1 segments patent bilaterally. Normal
anterior communicating artery complex. Anterior cerebral arteries
widely patent without stenosis. No M1 stenosis or occlusion. Normal
MCA bifurcations. No proximal MCA branch occlusion. Distal MCA
branches well perfused and symmetric.

Posterior circulation: Both vertebral arteries widely patent to the
vertebrobasilar junction without stenosis. Right PICA patent. Left
PICA not well seen. Basilar patent without stenosis. Superior
cerebral arteries patent bilaterally. Both PCAs primarily supplied
via the basilar well perfused or distal aspects without stenosis.

Venous sinuses: Patent allowing for timing the contrast bolus.

Anatomic variants: None significant.  No aneurysm.

Review of the MIP images confirms the above findings
IMPRESSION: 1. Negative CTA of the head and neck. No large vessel occlusion or
other emergent finding.
2. Mild for age atheromatous change about the carotid bifurcations
and carotid siphons without hemodynamically significant or
correctable stenosis.

## 2021-11-02 MED ORDER — ROSUVASTATIN CALCIUM 20 MG PO TABS: 20.0000 mg | ORAL_TABLET | Freq: Every day | ORAL | 0 refills | Status: AC

## 2021-11-02 MED ORDER — ASPIRIN EC 81 MG PO TBEC
81.0000 mg | DELAYED_RELEASE_TABLET | Freq: Every day | ORAL | Status: DC
  Administered 2021-11-02: 81 mg via ORAL
  Filled 2021-11-02: qty 1

## 2021-11-02 MED ORDER — MEMANTINE HCL 5 MG PO TABS
5.0000 mg | ORAL_TABLET | Freq: Two times a day (BID) | ORAL | Status: DC
  Administered 2021-11-02: 5 mg via ORAL
  Filled 2021-11-02 (×3): qty 1

## 2021-11-02 MED ORDER — IOHEXOL 350 MG/ML SOLN
100.0000 mL | Freq: Once | INTRAVENOUS | Status: AC | PRN
Start: 2021-11-02 — End: 2021-11-02
  Administered 2021-11-02: 100 mL via INTRAVENOUS

## 2021-11-02 MED ORDER — ENOXAPARIN SODIUM 40 MG/0.4ML IJ SOSY
40.0000 mg | PREFILLED_SYRINGE | INTRAMUSCULAR | Status: DC
  Administered 2021-11-02: 40 mg via SUBCUTANEOUS
  Filled 2021-11-02: qty 0.4

## 2021-11-02 MED ORDER — CLOPIDOGREL BISULFATE 75 MG PO TABS
75.0000 mg | ORAL_TABLET | Freq: Every day | ORAL | 0 refills | Status: DC
Start: 2021-11-03 — End: 2021-12-03

## 2021-11-02 MED ORDER — ROSUVASTATIN CALCIUM 20 MG PO TABS
20.0000 mg | ORAL_TABLET | Freq: Every day | ORAL | Status: DC
  Administered 2021-11-02: 20 mg via ORAL
  Filled 2021-11-02: qty 1

## 2021-11-02 MED ORDER — STROKE: EARLY STAGES OF RECOVERY BOOK
Freq: Once | Status: DC
  Filled 2021-11-02: qty 1

## 2021-11-02 NOTE — ED Notes (Signed)
Pt called and spoke with his wife to make sure she got home safely ?

## 2021-11-02 NOTE — Assessment & Plan Note (Signed)
Patient reports drinking 2 beers every night. ?-CIWA monitoring ?

## 2021-11-02 NOTE — Care Management Obs Status (Signed)
MEDICARE OBSERVATION STATUS NOTIFICATION ? ? ?Patient Details  ?Name: Ronnie Gonzalez ?MRN: [id number] ?Date of Birth: 1940-10-09 ? ? ?Medicare Observation Status Notification Given:  Yes ? ? ? Laurena Slimmer, RN ?11/02/2021, 5:00 PM ?

## 2021-11-02 NOTE — ED Notes (Signed)
Pt assisted back to bed

## 2021-11-02 NOTE — Progress Notes (Addendum)
STROKE TEAM PROGRESS NOTE  ? ?INTERVAL HISTORY ?His wife is at the bedside.  Per wife, went golfing on thursday and was upset that his swing was very bad. On Sunday, dropped a cup of coffee, dropped a plate later in the day. He felt like his R side is lame. Sunday morning, he was driving them to church and his driving was not smooth. This is very unusual for him. Patient reports tingling sensation in right arm that he told his wife about on Friday, however he endorses this starting approximately 2 weeks ago.  ?MRI scan shows a small left thalamic lacunar stroke.  CT angiogram showed no significant large vessel stenosis or occlusion. ?Vitals:  ? 11/01/21 1830 11/01/21 1918 11/02/21 0309 11/02/21 0724  ?BP: (!) 152/65 (!) 170/69 (!) 146/65 (!) 147/80  ?Pulse: 62 65 69 71  ?Resp: '19 16 16 16  '$ ?Temp:   98.2 ?F (36.8 ?C) 97.9 ?F (36.6 ?C)  ?TempSrc:    Oral  ?SpO2: 99% 100% 98% 98%  ?Weight:      ?Height:      ? ?CBC:  ?Recent Labs  ?Lab 11/01/21 ?1538 11/01/21 ?1608  ?WBC 5.3  --   ?NEUTROABS  --  3.0  ?HGB 13.6  --   ?HCT 41.4  --   ?MCV 95.2  --   ?PLT 204  --   ? ?Basic Metabolic Panel:  ?Recent Labs  ?Lab 11/01/21 ?1538  ?NA 140  ?K 3.8  ?CL 105  ?CO2 26  ?GLUCOSE 87  ?BUN 14  ?CREATININE 0.66  ?CALCIUM 9.4  ? ?Lipid Panel:  ?Recent Labs  ?Lab 11/02/21 ?1610  ?CHOL 139  ?TRIG 79  ?HDL 35*  ?CHOLHDL 4.0  ?VLDL 16  ?[place] 88  ? ?HgbA1c: No results for input(s): HGBA1C in the last 168 hours. ?Urine Drug Screen:  ?Recent Labs  ?Lab 11/01/21 ?1608  ?LABOPIA NONE DETECTED  ?COCAINSCRNUR NONE DETECTED  ?LABBENZ NONE DETECTED  ?AMPHETMU NONE DETECTED  ?THCU NONE DETECTED  ?LABBARB NONE DETECTED  ?  ?Alcohol Level  ?Recent Labs  ?Lab 11/01/21 ?1608  ?ETH <10  ? ? ?IMAGING past 24 hours ?CT ANGIO HEAD NECK W WO CM ? ?Result Date: 11/02/2021 ?CLINICAL DATA:  Follow-up examination for acute stroke. EXAM: CT ANGIOGRAPHY HEAD AND NECK TECHNIQUE: Multidetector CT imaging of the head and neck was performed using the standard  protocol during bolus administration of intravenous contrast. Multiplanar CT image reconstructions and MIPs were obtained to evaluate the vascular anatomy. Carotid stenosis measurements (when applicable) are obtained utilizing NASCET criteria, using the distal internal carotid diameter as the denominator. RADIATION DOSE REDUCTION: This exam was performed according to the departmental dose-optimization program which includes automated exposure control, adjustment of the mA and/or kV according to patient size and/or use of iterative reconstruction technique. CONTRAST:  161m OMNIPAQUE IOHEXOL 350 MG/ML SOLN COMPARISON:  Prior CT and MRI from 11/01/2021. FINDINGS: CT HEAD FINDINGS Brain: Age-related cerebral atrophy with mild chronic small vessel ischemic disease. Previously identified acute left thalamic infarct again noted, stable. No other large vessel territory infarct. No intracranial hemorrhage. No mass lesion or midline shift. No hydrocephalus or extra-axial fluid collection. Vascular: No hyperdense vessel. Skull: Scalp soft tissues and calvarium within normal limits. Sinuses: Clear. Orbits: Unremarkable. Review of the MIP images confirms the above findings CTA NECK FINDINGS Aortic arch: Visualized aortic arch normal caliber with normal branch pattern. Mild plaque about the arch itself. No significant stenosis about the origin the great vessels. Right carotid system:  Right common and internal carotid arteries patent without significant stenosis or dissection. Mild for age atheromatous change about the right carotid bulb without stenosis. Left carotid system: Left common and internal carotid arteries patent without significant stenosis or dissection. Mild atheromatous change about the left carotid bulb without significant stenosis. Vertebral arteries: Both vertebral arteries arise from subclavian arteries. No proximal subclavian artery stenosis. Left vertebral artery dominant. Vertebral arteries patent without  stenosis or dissection. Skeleton: No discrete or worrisome osseous lesions. Moderate cervical spondylosis without high-grade stenosis. Patient is edentulous. Other neck: No other acute soft tissue abnormality within the neck. Upper chest: Visualized upper chest demonstrates no acute finding. Review of the MIP images confirms the above findings CTA HEAD FINDINGS Anterior circulation: Petrous segments patent bilaterally. Mild for age atheromatous change within the carotid siphons without significant stenosis. A1 segments patent bilaterally. Normal anterior communicating artery complex. Anterior cerebral arteries widely patent without stenosis. No M1 stenosis or occlusion. Normal MCA bifurcations. No proximal MCA branch occlusion. Distal MCA branches well perfused and symmetric. Posterior circulation: Both vertebral arteries widely patent to the vertebrobasilar junction without stenosis. Right PICA patent. Left PICA not well seen. Basilar patent without stenosis. Superior cerebral arteries patent bilaterally. Both PCAs primarily supplied via the basilar well perfused or distal aspects without stenosis. Venous sinuses: Patent allowing for timing the contrast bolus. Anatomic variants: None significant.  No aneurysm. Review of the MIP images confirms the above findings IMPRESSION: 1. Negative CTA of the head and neck. No large vessel occlusion or other emergent finding. 2. Mild for age atheromatous change about the carotid bifurcations and carotid siphons without hemodynamically significant or correctable stenosis. Electronically Signed   By: Jeannine Boga M.D.   On: 11/02/2021 04:13  ? ?CT HEAD WO CONTRAST ? ?Result Date: 11/01/2021 ?CLINICAL DATA:  Numbness and tingling to right arm and right leg since Friday. EXAM: CT HEAD WITHOUT CONTRAST TECHNIQUE: Contiguous axial images were obtained from the base of the skull through the vertex without intravenous contrast. RADIATION DOSE REDUCTION: This exam was performed  according to the departmental dose-optimization program which includes automated exposure control, adjustment of the mA and/or kV according to patient size and/or use of iterative reconstruction technique. COMPARISON:  MR head 05/09/2021 FINDINGS: Brain: No evidence of acute infarction, hemorrhage, hydrocephalus, extra-axial collection or mass lesion/mass effect. Generalized moderate parenchymal volume loss with concordant dilatation of the ventricles, similar to MR head 05/09/2021. Vascular: No hyperdense vessel or unexpected calcification. Skull: Normal. Negative for fracture or focal lesion. Sinuses/Orbits: No acute finding. Paranasal sinuses and mastoid air cells are clear. Other: None. IMPRESSION: No acute intracranial abnormality. Stable moderate generalized parenchymal volume loss. Electronically Signed   By: Ileana Roup M.D.   On: 11/01/2021 16:46  ? ?MR BRAIN WO CONTRAST ? ?Result Date: 11/01/2021 ?CLINICAL DATA:  Initial evaluation for neuro deficit, stroke suspected. EXAM: MRI HEAD WITHOUT CONTRAST TECHNIQUE: Multiplanar, multiecho pulse sequences of the brain and surrounding structures were obtained without intravenous contrast. COMPARISON:  Prior CT from earlier the same day. FINDINGS: Brain: Generalized age-related cerebral volume loss. Mild patchy T2/FLAIR hyperintensity involving the periventricular and deep white matter both cerebral hemispheres most consistent with chronic small vessel ischemic disease, minimal for age. 1.5 cm acute ischemic infarct present at the left thalamus. No associated hemorrhage or mass effect. No other evidence for acute or subacute ischemia. No other areas of chronic cortical infarction. No acute or chronic intracranial blood products. No mass lesion, midline shift or mass effect. No hydrocephalus or  extra-axial fluid collection. Pituitary gland suprasellar region normal. Vascular: Major intracranial vascular flow voids are maintained. Skull and upper cervical spine:  Craniocervical junction within normal limits. Bone marrow signal intensity heterogeneous but overall within normal limits. No scalp soft tissue abnormality. Sinuses/Orbits: Prior bilateral ocular lens replacement. P

## 2021-11-02 NOTE — H&P (Signed)
?History and Physical  ? ? Ronnie Gonzalez EXB:[id number] DOB: [birth year] DOA: 11/01/2021 ? ?PCP: Briscoe Deutscher, MD ? ?Patient coming from: Home ? ?Chief Complaint: Right-sided paresthesias ? ?HPI: Ronnie Gonzalez is a 81 y.o. male with medical history significant of dementia presented to [place] ED with complaints of right-sided paresthesias and questionable weakness.  Out of tPA window at the time of arrival.  CT head negative for acute finding.  Sent to [facility] ED for MRI given concern for acute stroke.  Neurology consulted.  MRI showing a 1.5 cm acute ischemic nonhemorrhagic left thalamic infarct.  No significant lab abnormalities. ? ?Patient reports tingling of his entire right upper extremity and right lower extremity below the level of the knee which has been going on for several weeks.  No facial involvement.  Denies any weakness.  Denies vision changes or problems with speech.  Denies history of stroke, diabetes, hypertension, or hyperlipidemia.  States he used to smoke cigarettes a long time ago and quit by the time he was 34.  He drinks 2 beers every night.  States he has always been quite physically active and used to run marathons.  No other complaints.  Denies fevers, cough, shortness of breath, chest pain, nausea, vomiting, abdominal pain, diarrhea, or dysuria. ? ?Review of Systems:  ?Review of Systems  ?All other systems reviewed and are negative. ? ?Past Medical History:  ?Diagnosis Date  ? Diverticular disease   ? Mild dementia ([place])   ? ? ?Past Surgical History:  ?Procedure Laterality Date  ? COLON SURGERY    ? TONSILLECTOMY    ? ? ? reports that he quit smoking about 49 years ago. His smoking use included cigarettes. He has never used smokeless tobacco. He reports current alcohol use of about 10.0 standard drinks per week. He reports that he does not use drugs. ? ?Allergies  ?Allergen Reactions  ? Other   ? ? ?History reviewed. No pertinent family history. ? ?Prior to  Admission medications   ?Medication Sig Start Date End Date Taking? Authorizing Provider  ?Ascorbic Acid (VITAMIN C) 1000 MG tablet Take 1,000 mg by mouth daily.   Yes [provider]  ?aspirin EC 81 MG tablet Take 81 mg by mouth daily. Swallow whole.   Yes [provider]  ?Cholecalciferol (VITAMIN D3 SUPER STRENGTH) 50 MCG (2000 UT) CAPS Take 2,000 Units by mouth daily.   Yes [provider]  ?Cyanocobalamin (VITAMIN B-12) 2500 MCG SUBL Place 2,500 mcg under the tongue daily.   Yes [provider]  ?GLUCOSAMINE-CHONDROITIN PO Take 1 capsule by mouth daily.   Yes [provider]  ?Magnesium 250 MG TABS Take 250 mg by mouth daily.   Yes [provider]  ?memantine (NAMENDA) 5 MG tablet Take 1 tablet (5 mg at night) for 2 weeks, then increase to 1 tablet (5 mg) twice a day ?Patient taking differently: Take 5 mg by mouth 2 (two) times daily. 06/10/21  Yes Rondel Jumbo, PA-C  ?Multiple Vitamin (MULTIVITAMIN WITH MINERALS) TABS Take 1 tablet by mouth daily.   Yes [provider]  ?Omega-3 Fatty Acids (FISH OIL) 1000 MG CPDR Take 1,000 mg by mouth daily.   Yes [provider]  ?vitamin E 180 MG (400 UNITS) capsule Take 400 Units by mouth daily.   Yes [provider]  ?zinc gluconate 50 MG tablet Take 50 mg by mouth daily.   Yes [provider]  ?COVID-19 mRNA vaccine, Moderna, 100  MCG/0.5ML injection Inject into the muscle. ?Patient not taking: Reported on 05/08/2021 12/10/20   Carlyle Basques, MD  ? ? ?Physical Exam: ?Vitals:  ? 11/01/21 1715 11/01/21 1745 11/01/21 1830 11/01/21 1918  ?BP: (!) 142/67 (!) 141/71 (!) 152/65 (!) 170/69  ?Pulse: 63 62 62 65  ?Resp: '18 20 19 16  '$ ?Temp:      ?SpO2: 100% 97% 99% 100%  ?Weight:      ?Height:      ? ? ?Physical Exam ?Vitals reviewed.  ?Constitutional:   ?   General: He is not in acute distress. ?HENT:  ?   Head: Normocephalic and atraumatic.  ?Eyes:  ?   Extraocular Movements: Extraocular  movements intact.  ?   Conjunctiva/sclera: Conjunctivae normal.  ?Cardiovascular:  ?   Rate and Rhythm: Normal rate and regular rhythm.  ?   Pulses: Normal pulses.  ?Pulmonary:  ?   Effort: Pulmonary effort is normal. No respiratory distress.  ?   Breath sounds: Normal breath sounds. No wheezing or rales.  ?Abdominal:  ?   General: Bowel sounds are normal. There is no distension.  ?   Palpations: Abdomen is soft.  ?   Tenderness: There is no abdominal tenderness.  ?Musculoskeletal:     ?   General: No swelling or tenderness.  ?   Cervical back: Normal range of motion and neck supple.  ?Skin: ?   General: Skin is warm and dry.  ?Neurological:  ?   General: No focal deficit present.  ?   Mental Status: He is alert and oriented to person, place, and time.  ?   Cranial Nerves: No cranial nerve deficit.  ?   Sensory: No sensory deficit.  ?   Motor: No weakness.  ?  ? ?Labs on Admission: I have personally reviewed following labs and imaging studies ? ?CBC: ?Recent Labs  ?Lab 11/01/21 ?1538 11/01/21 ?1608  ?WBC 5.3  --   ?NEUTROABS  --  3.0  ?HGB 13.6  --   ?HCT 41.4  --   ?MCV 95.2  --   ?PLT 204  --   ? ?Basic Metabolic Panel: ?Recent Labs  ?Lab 11/01/21 ?1538  ?NA 140  ?K 3.8  ?CL 105  ?CO2 26  ?GLUCOSE 87  ?BUN 14  ?CREATININE 0.66  ?CALCIUM 9.4  ? ?GFR: ?Estimated Creatinine Clearance: 73.5 mL/min (by C-G formula based on SCr of 0.66 mg/dL). ?Liver Function Tests: ?No results for input(s): AST, ALT, ALKPHOS, BILITOT, PROT, ALBUMIN in the last 168 hours. ?No results for input(s): LIPASE, AMYLASE in the last 168 hours. ?No results for input(s): AMMONIA in the last 168 hours. ?Coagulation Profile: ?Recent Labs  ?Lab 11/01/21 ?1608  ?INR 1.0  ? ?Cardiac Enzymes: ?No results for input(s): CKTOTAL, CKMB, CKMBINDEX, TROPONINI in the last 168 hours. ?BNP (last 3 results) ?No results for input(s): PROBNP in the last 8760 hours. ?HbA1C: ?No results for input(s): HGBA1C in the last 72 hours. ?CBG: ?Recent Labs  ?Lab  11/01/21 ?1528  ?GLUCAP 86  ? ?Lipid Profile: ?No results for input(s): CHOL, HDL, LDLCALC, TRIG, CHOLHDL, LDLDIRECT in the last 72 hours. ?Thyroid Function Tests: ?No results for input(s): TSH, T4TOTAL, FREET4, T3FREE, THYROIDAB in the last 72 hours. ?Anemia Panel: ?No results for input(s): VITAMINB12, FOLATE, FERRITIN, TIBC, IRON, RETICCTPCT in the last 72 hours. ?Urine analysis: ?   ?Component Value Date/Time  ? [place] YELLOW 11/01/2021 1530  ? APPEARANCEUR CLEAR 11/01/2021 1530  ? LABSPEC 1.018 11/01/2021 1530  ? PHURINE 6.5  11/01/2021 1530  ? GLUCOSEU NEGATIVE 11/01/2021 1530  ? [place] NEGATIVE 11/01/2021 1530  ? [place] NEGATIVE 11/01/2021 1530  ? [place] NEGATIVE 11/01/2021 1530  ? [place] NEGATIVE 11/01/2021 1530  ? NITRITE NEGATIVE 11/01/2021 1530  ? LEUKOCYTESUR NEGATIVE 11/01/2021 1530  ? ? ?Radiological Exams on Admission: I have personally reviewed images ?CT HEAD WO CONTRAST ? ?Result Date: 11/01/2021 ?CLINICAL DATA:  Numbness and tingling to right arm and right leg since Friday. EXAM: CT HEAD WITHOUT CONTRAST TECHNIQUE: Contiguous axial images were obtained from the base of the skull through the vertex without intravenous contrast. RADIATION DOSE REDUCTION: This exam was performed according to the departmental dose-optimization program which includes automated exposure control, adjustment of the mA and/or kV according to patient size and/or use of iterative reconstruction technique. COMPARISON:  MR head 05/09/2021 FINDINGS: Brain: No evidence of acute infarction, hemorrhage, hydrocephalus, extra-axial collection or mass lesion/mass effect. Generalized moderate parenchymal volume loss with concordant dilatation of the ventricles, similar to MR head 05/09/2021. Vascular: No hyperdense vessel or unexpected calcification. Skull: Normal. Negative for fracture or focal lesion. Sinuses/Orbits: No acute finding. Paranasal sinuses and mastoid air cells are clear. Other: None. IMPRESSION: No acute  intracranial abnormality. Stable moderate generalized parenchymal volume loss. Electronically Signed   By: Ileana Roup M.D.   On: 11/01/2021 16:46  ? ?MR BRAIN WO CONTRAST ? ?Result Date: 11/01/2021 ?CLINICAL DATA:  Initial ev

## 2021-11-02 NOTE — Assessment & Plan Note (Signed)
Acute CVA ?Patient presenting with complaints of right-sided paresthesias and questionable weakness.  Out of tPA window at the time of arrival. MRI showing a 1.5 cm acute ischemic nonhemorrhagic left thalamic infarct.  No focal neurodeficit at this time. ?-Appreciate neurology recommendations ?-Telemetry monitoring ?-Allow for permissive hypertension up to 220/100 for the first 24 hours ?-CTA head and neck ?-Echocardiogram ?-Hemoglobin A1c, fasting lipid panel ?-Start statin if LDL >70 ?-Neurology recommending starting aspirin 81 mg daily along with Plavix 75 mg daily x21 days followed by aspirin 81 mg daily alone. ?-Frequent neurochecks ?-PT, OT, speech therapy. ?-N.p.o. until cleared by bedside swallow evaluation or formal speech evaluation  ?

## 2021-11-02 NOTE — Care Management CC44 (Signed)
Condition Code 44 Documentation Completed ? ?Patient Details  ?Name: Ronnie Gonzalez ?MRN: [id number] ?Date of Birth: 03/29/1941 ? ? ?Condition Code 44 given:  Yes ?Patient signature on Condition Code 44 notice:  Yes ?Documentation of 2 MD's agreement:  Yes ?Code 44 added to claim:  Yes ? ? ? Laurena Slimmer, RN ?11/02/2021, 5:00 PM ? ?

## 2021-11-02 NOTE — Assessment & Plan Note (Signed)
Continue Namenda

## 2021-11-02 NOTE — Evaluation (Signed)
Physical Therapy Evaluation ?Patient Details ?Name: Ronnie Gonzalez ?MRN: [id number] ?DOB: Dec 17, 1940 ?Today's Date: 11/02/2021 ? ?History of Present Illness ? Pt is an 81 yo M presenting to [facility] ED with R sided weakness and numbness/tingling. Imaging (+) for L thalamic CVA. PMH includes mild dementia  ?Clinical Impression ? Patient evaluated by Physical Therapy with no further acute PT needs identified. All education has been completed and the patient has no further questions. Pt overall steady with mobility tasks. Able to perform DGI tasks without LOB. Pt reporting some numbness in R foot and R hand, but did not seem to affect mobility. Pt reports wife available to assist if needed. See below for any follow-up Physical Therapy or equipment needs. PT is signing off. Thank you for this referral. If needs change, please re-consult.  ?   ?   ? ?Recommendations for follow up therapy are one component of a multi-disciplinary discharge planning process, led by the attending physician.  Recommendations may be updated based on patient status, additional functional criteria and insurance authorization. ? ?Follow Up Recommendations No PT follow up ? ?  ?Assistance Recommended at Discharge Intermittent Supervision/Assistance  ?Patient can return home with the following ? Assist for transportation ? ?  ?Equipment Recommendations None recommended by PT  ?Recommendations for Other Services ?    ?  ?Functional Status Assessment Patient has had a recent decline in their functional status and demonstrates the ability to make significant improvements in function in a reasonable and predictable amount of time.  ? ?  ?Precautions / Restrictions Precautions ?Precautions: None ?Restrictions ?Weight Bearing Restrictions: No  ? ?  ? ?Mobility ? Bed Mobility ?Overal bed mobility: Modified Independent ?  ?  ?  ?  ?  ?  ?  ?  ? ?Transfers ?Overall transfer level: Independent ?  ?  ?  ?  ?  ?  ?  ?  ?  ?  ? ?Ambulation/Gait ?Ambulation/Gait  assistance: Independent ?Gait Distance (Feet): 200 Feet ?Assistive device: None ?Gait Pattern/deviations: WFL(Within Functional Limits) ?Gait velocity: WFL ?  ?  ?General Gait Details: Overall steady with mobility. Able to perform DGI tasks without LOB. ? ?Stairs ?  ?  ?  ?  ?  ? ?Wheelchair Mobility ?  ? ?Modified Rankin (Stroke Patients Only) ?Modified Rankin (Stroke Patients Only) ?Pre-Morbid Rankin Score: No symptoms ?Modified Rankin: No significant disability ? ?  ? ?Balance Overall balance assessment: Independent ?  ?  ?  ?  ?  ?  ?  ?  ?  ?  ?  ?  ?  ?  ?  ?Standardized Balance Assessment ?Standardized Balance Assessment : Dynamic Gait Index ?  ?Dynamic Gait Index ?Level Surface: Normal ?Change in Gait Speed: Normal ?Gait with Horizontal Head Turns: Normal ?Gait with Vertical Head Turns: Normal ?Gait and Pivot Turn: Normal ?Step Over Obstacle: Normal ?Step Around Obstacles: Normal ?   ? ? ? ?Pertinent Vitals/Pain Pain Assessment ?Pain Assessment: No/denies pain  ? ? ?Home Living Family/patient expects to be discharged to:: Private residence ?Living Arrangements: Spouse/significant other ?Available Help at Discharge: Family;Available 24 hours/day ?Type of Home: House ?Home Access: Stairs to enter ?Entrance Stairs-Rails: Left ?Entrance Stairs-Number of Steps: 5 ?Alternate Level Stairs-Number of Steps: flight ?Home Layout: Two level;Able to live on main level with bedroom/bathroom ?Home Equipment: Grab bars - tub/shower ?Additional Comments: daughters live nearby and are nurses that can assist as needed  ?  ?Prior Function Prior Level of Function : Independent/Modified Independent ?  ?  ?  ?  ?  ?  ?  Mobility Comments: walks and plays golf regularly ?  ?  ? ? ?Hand Dominance  ?   ? ?  ?Extremity/Trunk Assessment  ? Upper Extremity Assessment ?Upper Extremity Assessment: Defer to OT evaluation (RUE tingling in hand and forearm) ?  ? ?Lower Extremity Assessment ?Lower Extremity Assessment: RLE deficits/detail ?RLE  Deficits / Details: Tingling in foot ?  ? ?Cervical / Trunk Assessment ?Cervical / Trunk Assessment: Normal  ?Communication  ? Communication: HOH  ?Cognition Arousal/Alertness: Awake/alert ?Behavior During Therapy: [employer] for tasks assessed/performed ?Overall Cognitive Status: History of cognitive impairments - at baseline ?  ?  ?  ?  ?  ?  ?  ?  ?  ?  ?  ?  ?  ?  ?  ?  ?General Comments: Mild dementia at baseline ?  ?  ? ?  ?General Comments   ? ?  ?Exercises    ? ?Assessment/Plan  ?  ?PT Assessment Patient does not need any further PT services  ?PT Problem List   ? ?   ?  ?PT Treatment Interventions     ? ?PT Goals (Current goals can be found in the Care Plan section)  ?Acute Rehab PT Goals ?Patient Stated Goal: to go home ?PT Goal Formulation: With patient ?Time For Goal Achievement: 11/02/21 ?Potential to Achieve Goals: Good ? ?  ?Frequency   ?  ? ? ?Co-evaluation   ?  ?  ?  ?  ? ? ?  ?AM-PAC PT "6 Clicks" Mobility  ?Outcome Measure Help needed turning from your back to your side while in a flat bed without using bedrails?: None ?Help needed moving from lying on your back to sitting on the side of a flat bed without using bedrails?: None ?Help needed moving to and from a bed to a chair (including a wheelchair)?: None ?Help needed standing up from a chair using your arms (e.g., wheelchair or bedside chair)?: None ?Help needed to walk in hospital room?: None ?Help needed climbing 3-5 steps with a railing? : None ?6 Click Score: 24 ? ?  ?End of Session Equipment Utilized During Treatment: Gait belt ?Activity Tolerance: Patient tolerated treatment well ?Patient left: in bed;with call bell/phone within reach;with family/visitor present;Other (comment) (on stretcher in ED) ?Nurse Communication: Mobility status ?PT Visit Diagnosis: Other symptoms and signs involving the nervous system (R29.898) ?  ? ?Time: 1135-1200 ?PT Time Calculation (min) (ACUTE ONLY): 25 min ? ? ?Charges:   PT Evaluation ?$PT Eval Low Complexity: 1  Low ?PT Treatments ?$Gait Training: 8-22 mins ?  ?   ? ? ?Reuel Derby, PT, DPT  ?Acute Rehabilitation Services  ?Pager: [phone] ?Office: [phone] ? ? ?Ronnie Gonzalez ?11/02/2021, 1:06 PM ?

## 2021-11-02 NOTE — ED Notes (Signed)
The pt woke up to use the urinal and he was confused about where he was  he was looking for his wife  he had told me once that his wife had gone home and she would come back later. He told me that they lived in [place]  .  He finally realized that he was in [place] ?

## 2021-11-02 NOTE — Discharge Summary (Signed)
?                                                                                ? Ronnie Gonzalez:[id number] DOB: 05/13/1941 DOA: 11/01/2021 ? ?PCP: Briscoe Deutscher, MD ? ?Admit date: 11/01/2021  Discharge date: 11/02/2021 ? ?Admitted From: Home   disposition: Home ? ? ?Recommendations for Outpatient Follow-up:  ? ?Follow up with PCP in 1-2 weeks to review that you are now taking Plavix and Crestor.  ? ? ?Home Health: N/A ?Equipment/Devices: N/A ?Consultations: Neurology ?Discharge Condition: Improved ?CODE STATUS: Full ?Diet Recommendation: Heart Healthy  ? ?Diet Order   ? ?       ?  Diet Heart Room service appropriate? Yes; Fluid consistency: Thin  Diet effective now       ?  ?  Diet - low sodium heart healthy       ?  ? ?  ?  ? ?  ?  ? ?Chief Complaint  ?Patient presents with  ? Tingling  ?  ? ?Brief history of present illness from the day of admission and additional interim summary   ? ?Ronnie Gonzalez is a 81 y.o. male with medical history significant of dementia presented to [facility] ED with complaints of right-sided paresthesias and questionable weakness.  Out of tPA window at the time of arrival.  CT head negative for acute finding.  Sent to [facility] ED for MRI given concern for acute stroke.  Neurology consulted.  MRI showing a 1.5 cm acute ischemic nonhemorrhagic left thalamic infarct.  No significant lab abnormalities. ?  ?Patient reports tingling of his entire right upper extremity and right lower extremity below the level of the knee which has been going on for several weeks.  No facial involvement.  Denies any weakness.  Denies vision changes or problems with speech.  Denies history of stroke, diabetes, hypertension, or hyperlipidemia.  States he used to smoke cigarettes a long time ago and quit by the time he was 55.  He drinks 2 beers every night.  States he has always been quite physically  active and used to run marathons.  No other complaints.  Denies fevers, cough, shortness of breath, chest pain, nausea, vomiting, abdominal pain, diarrhea, or dysuria. ? ? ?                                                               Hospital Course  ? ? ?Mr. Ronnie Gonzalez is a 81 y.o. male with history of diverticular disease, dementia presenting with right side numbness and weakness that he reported to his wife on Friday, March 31. MRI shows an acute left thalamic infarct. Plan for DAPT x3 weeks and then Plavix alone. Follow up with neurology outpatient.  ?  ?Stroke:  Left thalmic infarct likely secondary to small vessel disease source ?Code Stroke CT head No acute abnormality.  ?CTA head & neck No LVO, Mild for age atheromatous change about  the carotid bifurcations and carotid siphons without hemodynamically significant or correctable stenosis. ?MRI  1.5 cm acute ischemic nonhemorrhagic left thalamic infarct. ?2D Echo EF 55 - 60% with normal function ?LDL 88--will add Crestor 20 mg a day for goal of LDL less than 70 ?Patient to be on DAPT x3 weeks and then Plavix alone thereafter ?Patient should not drive while he has numbness of his right leg. ?  ? ? ?Discharge diagnosis   ? ? ?Principal Problem: ?  Acute CVA (cerebrovascular accident) ([place]) ?Active Problems: ?  Dementia ([place]) ?  Alcohol use ? ? ? ?Discharge instructions   ? ?Discharge Instructions   ? ? Diet - low sodium heart healthy   Complete by: As directed ?  ? Increase activity slowly   Complete by: As directed ?  ? ?  ? ? ?Discharge Medications  ? ?Allergies as of 11/02/2021   ? ?   Reactions  ? Other   ? ?  ? ?  ?Medication List  ?  ? ?STOP taking these medications   ? ?Moderna COVID-19 Vaccine 100 MCG/0.5ML injection ?Generic drug: COVID-19 mRNA vaccine (Moderna) ?  ? ?  ? ?TAKE these medications   ? ?aspirin EC 81 MG tablet ?Take 81 mg by mouth daily. Swallow whole. ?  ?clopidogrel 75 MG tablet ?Commonly known as: PLAVIX ?Take 1 tablet (75 mg  total) by mouth daily. ?Start taking on: November 03, 2021 ?  ?Fish Oil 1000 MG Cpdr ?Take 1,000 mg by mouth daily. ?  ?GLUCOSAMINE-CHONDROITIN PO ?Take 1 capsule by mouth daily. ?  ?Magnesium 250 MG Tabs ?Take 250 mg by mouth daily. ?  ?memantine 5 MG tablet ?Commonly known as: NAMENDA ?Take 1 tablet (5 mg at night) for 2 weeks, then increase to 1 tablet (5 mg) twice a day ?What changed:  ?how much to take ?how to take this ?when to take this ?additional instructions ?  ?multivitamin with minerals Tabs tablet ?Take 1 tablet by mouth daily. ?  ?rosuvastatin 20 MG tablet ?Commonly known as: CRESTOR ?Take 1 tablet (20 mg total) by mouth daily. ?Start taking on: November 03, 2021 ?  ?Vitamin B-12 2500 MCG Subl ?Place 2,500 mcg under the tongue daily. ?  ?vitamin C 1000 MG tablet ?Take 1,000 mg by mouth daily. ?  ?Vitamin D3 Super Strength 50 MCG (2000 UT) Caps ?Generic drug: Cholecalciferol ?Take 2,000 Units by mouth daily. ?  ?vitamin E 180 MG (400 UNITS) capsule ?Take 400 Units by mouth daily. ?  ?zinc gluconate 50 MG tablet ?Take 50 mg by mouth daily. ?  ? ?  ? ? ? ? ?Major procedures and Radiology Reports - PLEASE review detailed and final reports thoroughly  -    ? ? ?CT ANGIO HEAD NECK W WO CM ? ?Result Date: 11/02/2021 ?CLINICAL DATA:  Follow-up examination for acute stroke. EXAM: CT ANGIOGRAPHY HEAD AND NECK TECHNIQUE: Multidetector CT imaging of the head and neck was performed using the standard protocol during bolus administration of intravenous contrast. Multiplanar CT image reconstructions and MIPs were obtained to evaluate the vascular anatomy. Carotid stenosis measurements (when applicable) are obtained utilizing NASCET criteria, using the distal internal carotid diameter as the denominator. RADIATION DOSE REDUCTION: This exam was performed according to the departmental dose-optimization program which includes automated exposure control, adjustment of the mA and/or kV according to patient size and/or use of  iterative reconstruction technique. CONTRAST:  124m OMNIPAQUE IOHEXOL 350 MG/ML SOLN COMPARISON:  Prior CT and MRI from 11/01/2021. FINDINGS:  CT HEAD FINDINGS Brain: Age-related cerebral atrophy with mild chronic small vessel ischemic disease. Previously identified acute left thalamic infarct again noted, stable. No other large vessel territory infarct. No intracranial hemorrhage. No mass lesion or midline shift. No hydrocephalus or extra-axial fluid collection. Vascular: No hyperdense vessel. Skull: Scalp soft tissues and calvarium within normal limits. Sinuses: Clear. Orbits: Unremarkable. Review of the MIP images confirms the above findings CTA NECK FINDINGS Aortic arch: Visualized aortic arch normal caliber with normal branch pattern. Mild plaque about the arch itself. No significant stenosis about the origin the great vessels. Right carotid system: Right common and internal carotid arteries patent without significant stenosis or dissection. Mild for age atheromatous change about the right carotid bulb without stenosis. Left carotid system: Left common and internal carotid arteries patent without significant stenosis or dissection. Mild atheromatous change about the left carotid bulb without significant stenosis. Vertebral arteries: Both vertebral arteries arise from subclavian arteries. No proximal subclavian artery stenosis. Left vertebral artery dominant. Vertebral arteries patent without stenosis or dissection. Skeleton: No discrete or worrisome osseous lesions. Moderate cervical spondylosis without high-grade stenosis. Patient is edentulous. Other neck: No other acute soft tissue abnormality within the neck. Upper chest: Visualized upper chest demonstrates no acute finding. Review of the MIP images confirms the above findings CTA HEAD FINDINGS Anterior circulation: Petrous segments patent bilaterally. Mild for age atheromatous change within the carotid siphons without significant stenosis. A1 segments  patent bilaterally. Normal anterior communicating artery complex. Anterior cerebral arteries widely patent without stenosis. No M1 stenosis or occlusion. Normal MCA bifurcations. No proximal MCA branch occlusion. Dist

## 2021-11-02 NOTE — ED Notes (Signed)
Patient asked if his wife was here. I told him I didn't see her, so he asked if I could please see if she was in waiting area. I checked waiting room. his wife wasn't there. So I assist patient with phone to call wife at home. ?

## 2021-11-02 NOTE — ED Notes (Signed)
To ct

## 2021-11-23 DIAGNOSIS — I6381 Other cerebral infarction due to occlusion or stenosis of small artery: Secondary | ICD-10-CM | POA: Diagnosis not present

## 2021-12-09 ENCOUNTER — Encounter: Payer: Self-pay | Admitting: Physician Assistant

## 2021-12-09 ENCOUNTER — Ambulatory Visit (INDEPENDENT_AMBULATORY_CARE_PROVIDER_SITE_OTHER): Payer: Medicare Other | Admitting: Physician Assistant

## 2021-12-09 VITALS — BP 158/76 | HR 87 | Resp 20 | Ht 71.0 in | Wt 180.0 lb

## 2021-12-09 DIAGNOSIS — I639 Cerebral infarction, unspecified: Secondary | ICD-10-CM | POA: Diagnosis not present

## 2021-12-09 DIAGNOSIS — G3184 Mild cognitive impairment, so stated: Secondary | ICD-10-CM

## 2021-12-09 MED ORDER — MEMANTINE HCL 5 MG PO TABS: 5.0000 mg | ORAL_TABLET | Freq: Two times a day (BID) | ORAL | 11 refills | Status: DC

## 2021-12-09 MED ORDER — CLOPIDOGREL BISULFATE 75 MG PO TABS: 75.0000 mg | ORAL_TABLET | Freq: Every day | ORAL | 3 refills | Status: DC
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
## 2021-12-09 NOTE — Patient Instructions (Addendum)
It was a pleasure to see you today at our office.  ? ?Recommendations: ? ?Follow up in 3 months ?Continue Memantine 5 mg 1 tablet twice daily.   ?Start Plavix 75 mg daily  ?If any sign and  symptoms of a stroke call 911 ! ? ? ? ?RECOMMENDATIONS FOR ALL PATIENTS WITH MEMORY PROBLEMS: ?1. Continue to exercise (Recommend 30 minutes of walking everyday, or 3 hours every week) ?2. Increase social interactions - continue going to [place] and enjoy social gatherings with friends and family ?3. Eat healthy, avoid fried foods and eat more fruits and vegetables ?4. Maintain adequate blood pressure, blood sugar, and blood cholesterol level. Reducing the risk of stroke and cardiovascular disease also helps promoting better memory. ?5. Avoid stressful situations. Live a simple life and avoid aggravations. Organize your time and prepare for the next day in anticipation. ?6. Sleep well, avoid any interruptions of sleep and avoid any distractions in the bedroom that may interfere with adequate sleep quality ?7. Avoid sugar, avoid sweets as there is a strong link between excessive sugar intake, diabetes, and cognitive impairment ?We discussed the Mediterranean diet, which has been shown to help patients reduce the risk of progressive memory disorders and reduces cardiovascular risk. This includes eating fish, eat fruits and green leafy vegetables, nuts like almonds and hazelnuts, walnuts, and also use olive oil. Avoid fast foods and fried foods as much as possible. Avoid sweets and sugar as sugar use has been linked to worsening of memory function. ? ?There is always a concern of gradual progression of memory problems. If this is the case, then we may need to adjust level of care according to patient needs. Support, both to the patient and caregiver, should then be put into place.  ? ? ?FALL PRECAUTIONS: Be cautious when walking. Scan the area for obstacles that may increase the risk of trips and falls. When getting up in the  mornings, sit up at the edge of the bed for a few minutes before getting out of bed. Consider elevating the bed at the head end to avoid drop of blood pressure when getting up. Walk always in a well-lit room (use night lights in the walls). Avoid area rugs or power cords from appliances in the middle of the walkways. Use a walker or a cane if necessary and consider physical therapy for balance exercise. Get your eyesight checked regularly. ? ?FINANCIAL OVERSIGHT: Supervision, especially oversight when making financial decisions or transactions is also recommended. ? ?HOME SAFETY: Consider the safety of the kitchen when operating appliances like stoves, microwave oven, and blender. Consider having supervision and share cooking responsibilities until no longer able to participate in those. Accidents with firearms and other hazards in the house should be identified and addressed as well. ? ? ?ABILITY TO BE LEFT ALONE: If patient is unable to contact 911 operator, consider using Life[facility], or when the need is there, arrange for someone to stay with patients. Smoking is a fire hazard, consider supervision or cessation. Risk of wandering should be assessed by caregiver and if detected at any point, supervision and safe proof recommendations should be instituted. ? ?MEDICATION SUPERVISION: Inability to self-administer medication needs to be constantly addressed. Implement a mechanism to ensure safe administration of the medications. ? ? ?DRIVING: Regarding driving, in patients with progressive memory problems, driving will be impaired. We advise to have someone else do the driving if trouble finding directions or if minor accidents are reported. Independent driving assessment is available to determine  safety of driving. ? ? ?If you are interested in the driving assessment, you can contact the following: ? ?[employer] in [place] ? ?[place] [phone] ? ?[facility] [phone] ? ?[facility] [phone] or [phone] ?  ?

## 2021-12-11 ENCOUNTER — Telehealth: Payer: Self-pay | Admitting: Physician Assistant

## 2021-12-11 NOTE — Telephone Encounter (Signed)
Spoke with Nurse Tonia Ghent and confirmed asa was stopped and started on Plavix '75mg'$  1 tab po qd ?

## 2021-12-11 NOTE — Telephone Encounter (Signed)
Nurse ida called, needs to speak with christy about a couple of things. There seems to be some confusion ?

## 2022-01-13 DIAGNOSIS — G5691 Unspecified mononeuropathy of right upper limb: Secondary | ICD-10-CM | POA: Diagnosis not present

## 2022-01-20 DIAGNOSIS — E785 Hyperlipidemia, unspecified: Secondary | ICD-10-CM | POA: Diagnosis not present

## 2022-01-20 DIAGNOSIS — Z8673 Personal history of transient ischemic attack (TIA), and cerebral infarction without residual deficits: Secondary | ICD-10-CM | POA: Diagnosis not present

## 2022-01-27 DIAGNOSIS — G5601 Carpal tunnel syndrome, right upper limb: Secondary | ICD-10-CM | POA: Diagnosis not present

## 2022-03-03 ENCOUNTER — Ambulatory Visit (INDEPENDENT_AMBULATORY_CARE_PROVIDER_SITE_OTHER): Payer: Medicare Other | Admitting: Physician Assistant

## 2022-03-03 ENCOUNTER — Encounter: Payer: Self-pay | Admitting: Physician Assistant

## 2022-03-03 VITALS — BP 157/78 | HR 74 | Resp 18 | Ht 70.0 in | Wt 180.0 lb

## 2022-03-03 DIAGNOSIS — G309 Alzheimer's disease, unspecified: Secondary | ICD-10-CM

## 2022-03-03 DIAGNOSIS — I639 Cerebral infarction, unspecified: Secondary | ICD-10-CM

## 2022-03-03 DIAGNOSIS — F0280 Dementia in other diseases classified elsewhere without behavioral disturbance: Secondary | ICD-10-CM

## 2022-03-03 DIAGNOSIS — R413 Other amnesia: Secondary | ICD-10-CM

## 2022-03-03 MED ORDER — MEMANTINE HCL 10 MG PO TABS: ORAL_TABLET | ORAL | 11 refills | Status: DC

## 2022-03-03 NOTE — Patient Instructions (Addendum)
It was a pleasure to see you today at our office.   Recommendations:  Follow up in 6  months Repeat neurocognitive testing  Increase memantine to 10 mg 2 times a day  Discontinue alcohol  Monitor driving Continue Plavix 75 mg a day   Whom to call:  Memory  decline, memory medications: Call our office [phone]   For psychiatric meds, mood meds: Please have your primary care physician manage these medications.   Counseling regarding caregiver distress, including caregiver depression, anxiety and issues regarding community resources, adult day care programs, adult living facilities, or memory care questions:   Feel free to contact Misty Taylor Palladino, Social Worker at [phone]   For assessment of decision of mental capacity and competency:  Call Dr. Michelle Haber, geriatric psychiatrist at [phone]  For guidance in geriatric dementia issues please call [facility] [phone]  For guidance regarding [facility] and if placement were needed at the facility, contact Nicole Reynolds, Social Worker tel: [phone]  If you have any severe symptoms of a stroke, or other severe issues such as confusion,severe chills or fever, etc call 911 or go to the ER as you may need to be evaluated further   Feel free to visit Facebook page " [email or web address]" for tips of how to care for people with memory problems.     RECOMMENDATIONS FOR ALL PATIENTS WITH MEMORY PROBLEMS: 1. Continue to exercise (Recommend 30 minutes of walking everyday, or 3 hours every week) 2. Increase social interactions - continue going to [employer] and enjoy social gatherings with friends and family 3. Eat healthy, avoid fried foods and eat more fruits and vegetables 4. Maintain adequate blood pressure, blood sugar, and blood cholesterol level. Reducing the risk of stroke and cardiovascular disease also helps promoting better memory. 5. Avoid stressful situations. Live a simple life and avoid  aggravations. Organize your time and prepare for the next day in anticipation. 6. Sleep well, avoid any interruptions of sleep and avoid any distractions in the bedroom that may interfere with adequate sleep quality 7. Avoid sugar, avoid sweets as there is a strong link between excessive sugar intake, diabetes, and cognitive impairment We discussed the Mediterranean diet, which has been shown to help patients reduce the risk of progressive memory disorders and reduces cardiovascular risk. This includes eating fish, eat fruits and green leafy vegetables, nuts like almonds and hazelnuts, walnuts, and also use olive oil. Avoid fast foods and fried foods as much as possible. Avoid sweets and sugar as sugar use has been linked to worsening of memory function.  There is always a concern of gradual progression of memory problems. If this is the case, then we may need to adjust level of care according to patient needs. Support, both to the patient and caregiver, should then be put into place.    FALL PRECAUTIONS: Be cautious when walking. Scan the area for obstacles that may increase the risk of trips and falls. When getting up in the mornings, sit up at the edge of the bed for a few minutes before getting out of bed. Consider elevating the bed at the head end to avoid drop of blood pressure when getting up. Walk always in a well-lit room (use night lights in the walls). Avoid area rugs or power cords from appliances in the middle of the walkways. Use a walker or a cane if necessary and consider physical therapy for balance exercise. Get your eyesight checked regularly.  FINANCIAL OVERSIGHT: Supervision, especially oversight   when making financial decisions or transactions is also recommended.  HOME SAFETY: Consider the safety of the kitchen when operating appliances like stoves, microwave oven, and blender. Consider having supervision and share cooking responsibilities until no longer able to participate in  those. Accidents with firearms and other hazards in the house should be identified and addressed as well.   ABILITY TO BE LEFT ALONE: If patient is unable to contact 911 operator, consider using [employer], or when the need is there, arrange for someone to stay with patients. Smoking is a fire hazard, consider supervision or cessation. Risk of wandering should be assessed by caregiver and if detected at any point, supervision and safe proof recommendations should be instituted.  MEDICATION SUPERVISION: Inability to self-administer medication needs to be constantly addressed. Implement a mechanism to ensure safe administration of the medications.   DRIVING: Regarding driving, in patients with progressive memory problems, driving will be impaired. We advise to have someone else do the driving if trouble finding directions or if minor accidents are reported. Independent driving assessment is available to determine safety of driving.   If you are interested in the driving assessment, you can contact the following:  [employer] in [place] [phone]  [facility] [phone]  [facility] [phone]  [facility] [phone] or [phone]   

## 2022-03-03 NOTE — Progress Notes (Signed)
Assessment/Plan:   Late Onset Dementia due to Alzheimer's  Disease Alcohol Abuse   Ronnie Gonzalez is a very pleasant 81 y.o. RH male with a history of alcohol abuse, insomnia, OSA not on CPAP, peripheral polyneuropathy, hypertension, hyperlipidemia, CAD, anxiety, depression, seen today for follow-up for mild cognitive impairment likely due to Alzheimer's disease, presenting today in follow-up for evaluation of memory loss.  He is at risk for Warnicke Korsakoff syndrome given his confabulation and level of alcohol consumption. MMSE 03/03/22 is 23/30 with delayed recall 0/3 and orientation 1/5, some decline when compared to prior.   He is on memantine 5 mg twice daily (he had diarrhea with donepezil) and is tolerating it well but in view of these changes, will increase the dose to try to slow down his cognitive decline.    Continue memantine 5 mg twice daily, side effects discussed. Discontinue alcohol abuse Monitor driving Continue controlling cardiovascular risk factors, including Plavix 75 mg daily Follow-up in 6 months   Left thalamic stroke likely secondary to small vessel disease source  No further signs or symptoms of stroke, continue to monitor. Currently, he is on Plavix 75 mg daily  Case discussed with Dr. Delice Lesch who agrees with the plan    Subjective:   This patient is accompanied in the office by his wife who supplements the history.  Previous records as well as any outside records available were reviewed prior to todays visit.  Patient was last seen at our office on 12/09/2021 at which time his MMSE was 25/30.  He is on memantine 5 mg twice daily, tolerating well.   Any changes in memory since last visit? "He is super forgetful and his concentration is bad"  He also confabulates, for example, he says that he is playing  golf tournaments, but he just plays golf"-wife says. Patient lives with: wife repeats oneself?  Endorsed. "as much as before " Disoriented when walking  into a room?  Patient denies   Leaving objects in unusual places?  Endorsed. Ambulates  with difficulty?   Patient denies.  He continues to ambulate 10,000 steps daily with his dog, plays golf and [facility]. Recent falls?  Patient denies   Any head injuries?  Patient denies   History of seizures?   Patient denies   Wandering behavior?  Patient denies   Patient drives?   He continues to drive, but she has to be with him as a copilot, because he forgets to turn left or right Any mood changes such irritability agitation?  "Not different from before ".  He gets frustrated if his concentration plays games. Any history of depression?:  Patient denies   Hallucinations?  Patient denies   Paranoia?  Patient denies   Patient reports that he sleeps well without vivid dreams, REM behavior or sleepwalking.  He is also taking more naps during the day. History of sleep apnea?  He has a history of sleep apnea, but he is not on CPAP. Any hygiene concerns?  Patient denies   Independent of bathing and dressing?  Endorsed  Does the patient needs help with medications?  Wife is in charge, places then on a pillbox and monitors them. Who is in charge of the finances?  Son is in charge  Any changes in appetite?  Patient denies   Patient have trouble swallowing? Patient denies   Does the patient cook?  Patient denies   Any kitchen accidents such as leaving the stove on? Patient  denies   Any headaches?  Patient denies   Double vision? Patient denies   Any focal numbness or tingling?  No new signs or symptoms of a stroke. R arm still has lingering numbness but improved from prior.  Chronic back pain Patient denies   Unilateral weakness?  Patient denies   Any tremors?  Patient denies   Any history of anosmia?  Patient denies   Any incontinence of urine?  Very rare, but he refuses to use diapers or pads.  Any bowel dysfunction?   Patient denies      Any alcohol?  He continues to drink beer, and  although he says that he drinks only 2 small once a day, his wife reports that he drinks about 5 large cans a day, 2 glasses of wine, plus sometimes it tonic after dinner    Initial Visit 05/08/21 The patient is seen in neurologic consultation at the request of [name], MD for the evaluation of memory.  The patient is accompanied by wife who supplements the history. This is a 81 y.o. year old male who has had memory issues for about one year, worse since February of this year.  His wife noted that he was making mistakes regarding appointments.  For example, he was called to confirm this appointment, and he told his wife that he has some appointment in [facility], but he could not remember who had call him what was there for.  He forgets quickly his instructions. His wife states that he is unable to carry a detailed conversation and if there are several people in the conversation he becomes a listener, he does not enjoy partaking in it.  He will usually respond with generic one-liners.  When playing family games, he cannot follow below the directions of the game, but can certainly follow the directions if broken down into steps when his turn comes.  His memories of the past are skewed.  In the recent conversation regarding 911, he stated that he volunteered in the aftermath, which was not true. 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     He repeats the same questions, or stories.  His mood is good, denies any depression, or irritability.  He sleeps well with the help of sleep aids.  He denies any vivid dreams or sleepwalking, hallucinations or paranoia or leaving objects in unusual places.  He is independent of bathing and dressing, he is compliant with his medications, denies any issues with  finances.  His appetite is good, denies trouble swallowing.  His wife always cooked.  He ambulates without difficulty without the use of a walker or a cane.  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  He continues to drive, but he may become lost at times.   He denies any headaches, double vision, dizziness, focal numbness or tingling, unilateral weakness or tremors, urine incontinence or retention, denies constipation or diarrhea.  Denies anosmia.  Denies a history of sleep apnea.  He continues to drink 2 large cans of beer a day, as well as a repetitive after dinner.  He denies tobacco abuse.  Family history remarkable for one uncle, father and grandfather with dementia. Neuropsychological exam prior to this appointment (12/01/20)  showed significant memory problems likely due to storage problem, concerning for mild cognitive impairment being at risk for Alzheimer's clinical syndrome with questionable component of Warnicke-Korsakoff given his confabulation and level of alcohol consumption.   Left thalamic stroke likely secondary to small vessel disease source   Patient presented with entire right upper extremity and right lower extremity below the level of the knee tingling for several weeks, without facial involvement, or weakness.  He denies vision changes or problems with speech.  He never had a stroke before. Code stroke was called and CT of the head showed no acute abnormalities.  CT angio of the head and neck was negative for large vessel occlusion, mild for age atheromatous change about the carotid bifurcations and carotid siphons without hemodynamically significant or correctable stenosis was noted.  MRI of the brain was remarkable for 1.5 cm acute ischemic nonhemorrhagic left thalamic infarct.  2D echo showed normal EF between 55 and 60%, with normal function.  LDL was 88, Crestor 20 mg daily was added, with goal of LDL less than 70.  He was also placed on DAPT for 3  weeks and then was supposed to be on Plavix alone thereafter but he resumed ASA alone instead.  Because of numbness in his right leg, the patient should not drive.     Labs  04/29/21 LDL 101, TSH 0.86, MCV 95.5  o/w nl CBC, B1 240.09, B12 352   MRI brain wo contrast 05/09/21 No evidence of acute intracranial abnormality. Mild chronic small vessel ischemic changes within the cerebral white matter.Moderate generalized cerebral atrophy. Comparatively mild cerebellar  atrophy. Signal abnormality is questioned within the distal cervical right internal carotid artery (at the very caudal extent of the field of view).    CT angio neck w and wo contrast 05/14/21 No hemodynamically significant stenosis in the neck.   Past Medical History:  Diagnosis Date   Diverticular disease    Mild dementia ([place])      Past Surgical History:  Procedure Laterality Date   COLON SURGERY     TONSILLECTOMY       PREVIOUS MEDICATIONS:   CURRENT MEDICATIONS:  Outpatient Encounter Medications as of 03/03/2022  Medication Sig   Ascorbic Acid (VITAMIN C) 1000 MG tablet Take 1,000 mg by mouth daily.   Cholecalciferol (VITAMIN D3 SUPER STRENGTH) 50 MCG (2000 UT) CAPS Take 2,000 Units by mouth daily.   clopidogrel (PLAVIX) 75 MG tablet Take 1 tablet (75 mg total) by mouth daily.   Cyanocobalamin (VITAMIN B-12) 2500 MCG SUBL Place 2,500 mcg under the tongue daily.   GLUCOSAMINE-CHONDROITIN PO Take 1 capsule by mouth daily.   Magnesium 250 MG TABS Take 250 mg by mouth daily.   Multiple Vitamin (MULTIVITAMIN WITH MINERALS) TABS Take 1 tablet by mouth daily.   Omega-3 Fatty Acids (FISH OIL) 1000 MG CPDR Take 1,000 mg by mouth daily.   vitamin E 180 MG (400 UNITS) capsule Take 400 Units by mouth daily.   zinc gluconate 50 MG tablet Take 50 mg by mouth daily.   [DISCONTINUED] memantine (NAMENDA) 5 MG tablet Take 1 tablet (5 mg total) by mouth 2 (two) times daily.   memantine (NAMENDA) 10 MG tablet Take 1 tablet 2 times a day    rosuvastatin (CRESTOR) 20 MG tablet Take 1 tablet (20 mg total) by mouth daily.   No facility-administered encounter medications on file as of 03/03/2022.     Objective:     PHYSICAL EXAMINATION:    VITALS:  Vitals:   03/03/22 0858  BP: (!) 157/78  Pulse: 74  Resp: 18  SpO2: 98%  Weight: 180 lb (81.6 kg)  Height: '5\' 10"'$  (1.778 m)    GEN:  The patient appears stated age and is in NAD. HEENT:  Normocephalic, atraumatic.   Neurological examination:  General: NAD, well-groomed, appears stated age. Orientation: The patient is alert. Oriented to person, place but not to date, is January 03, 1981, is the fall, it is Wednesday Cranial nerves: There is good facial symmetry. The speech is fluent and clear. No aphasia or dysarthria. Fund of knowledge is appropriate. Recent and remote memory impaired.  Attention and concentration are normal.  Able to name objects and repeat phrases.  Hearing is intact to conversational tone.    Sensation: Sensation is intact to light touch except mild decrease in sensation around the right deltoid, and right triceps area. Motor: Strength is at least antigravity x4. Tremors: none  DTR's 2/4 in UE/LE       No data to display             03/03/2022   12:00 PM 12/09/2021    9:00 AM 11/24/2020    9:00 AM  MMSE - Mini Mental State Exam  Orientation to time '1 4 5  '$ Orientation to Place '5 5 5  '$ Registration '3 3 3  '$ Attention/ Calculation '5 4 4  '$ Recall 0 0 0  Language- name 2 objects '2 2 2  '$ Language- repeat '1 1 1  '$ Language- follow 3 step command '3 3 3  '$ Language- read & follow direction '1 1 1  '$ Write a sentence '1 1 1  '$ Copy design 1 1 0  Total score '23 25 25       '$ Movement examination: Tone: There is normal tone in the UE/LE Abnormal movements:  no tremor.  No myoclonus.  No asterixis.   Coordination:  There is no decremation with RAM's. Normal finger to nose  Gait and Station: The patient has no difficulty arising out of a deep-seated chair without  the use of the hands. The patient's stride length is good.  Gait is cautious and narrow.   Thank you for allowing [place] the opportunity to participate in the care of this nice patient. Please do not hesitate to contact us for any questions or concerns.   Total time spent on today's visit was 36 minutes dedicated to this patient today, preparing to see patient, examining the patient, ordering tests and/or medications and counseling the patient, documenting clinical information in the EHR or other health record, independently interpreting results and communicating results to the patient/family, discussing treatment and goals, answering patient's questions and coordinating care.  Cc:  Sheppard Coil [facility] 03/03/2022 12:01 PM

## 2022-03-15 DIAGNOSIS — M25561 Pain in right knee: Secondary | ICD-10-CM | POA: Diagnosis not present

## 2022-03-22 DIAGNOSIS — H353131 Nonexudative age-related macular degeneration, bilateral, early dry stage: Secondary | ICD-10-CM | POA: Diagnosis not present

## 2022-05-19 DIAGNOSIS — Z23 Encounter for immunization: Secondary | ICD-10-CM | POA: Diagnosis not present

## 2022-05-27 ENCOUNTER — Ambulatory Visit (HOSPITAL_COMMUNITY)
Admission: RE | Admit: 2022-05-27 | Discharge: 2022-05-27 | Disposition: A | Discharge status: Home / Self Care | Payer: Medicare Other | Source: Ambulatory Visit | Attending: Cardiology | Admitting: Cardiology

## 2022-05-27 ENCOUNTER — Other Ambulatory Visit (HOSPITAL_COMMUNITY): Payer: Self-pay | Admitting: Sports Medicine

## 2022-05-27 DIAGNOSIS — M79604 Pain in right leg: Secondary | ICD-10-CM | POA: Diagnosis not present

## 2022-05-27 DIAGNOSIS — M79606 Pain in leg, unspecified: Secondary | ICD-10-CM

## 2022-05-27 DIAGNOSIS — M79662 Pain in left lower leg: Secondary | ICD-10-CM | POA: Diagnosis not present

## 2022-05-27 DIAGNOSIS — M79605 Pain in left leg: Secondary | ICD-10-CM | POA: Diagnosis not present

## 2022-05-27 DIAGNOSIS — M79661 Pain in right lower leg: Secondary | ICD-10-CM | POA: Diagnosis not present

## 2022-05-27 DIAGNOSIS — M25561 Pain in right knee: Secondary | ICD-10-CM | POA: Diagnosis not present

## 2022-05-27 HISTORY — DX: Pain in leg, unspecified: M79.606

## 2022-05-31 DIAGNOSIS — M25561 Pain in right knee: Secondary | ICD-10-CM | POA: Diagnosis not present

## 2022-06-15 DIAGNOSIS — M1711 Unilateral primary osteoarthritis, right knee: Secondary | ICD-10-CM | POA: Diagnosis not present

## 2022-06-17 DIAGNOSIS — M1711 Unilateral primary osteoarthritis, right knee: Secondary | ICD-10-CM | POA: Diagnosis not present

## 2022-06-21 DIAGNOSIS — M1711 Unilateral primary osteoarthritis, right knee: Secondary | ICD-10-CM | POA: Diagnosis not present

## 2022-07-14 DIAGNOSIS — E785 Hyperlipidemia, unspecified: Secondary | ICD-10-CM | POA: Diagnosis not present

## 2022-07-14 DIAGNOSIS — Z23 Encounter for immunization: Secondary | ICD-10-CM | POA: Diagnosis not present

## 2022-07-14 DIAGNOSIS — Z8673 Personal history of transient ischemic attack (TIA), and cerebral infarction without residual deficits: Secondary | ICD-10-CM | POA: Diagnosis not present

## 2022-07-14 DIAGNOSIS — M1711 Unilateral primary osteoarthritis, right knee: Secondary | ICD-10-CM | POA: Diagnosis not present

## 2022-07-14 DIAGNOSIS — G3184 Mild cognitive impairment, so stated: Secondary | ICD-10-CM | POA: Diagnosis not present

## 2022-07-14 DIAGNOSIS — G4733 Obstructive sleep apnea (adult) (pediatric): Secondary | ICD-10-CM | POA: Diagnosis not present

## 2022-07-14 DIAGNOSIS — Z0000 Encounter for general adult medical examination without abnormal findings: Secondary | ICD-10-CM | POA: Diagnosis not present

## 2022-07-15 DIAGNOSIS — M25522 Pain in left elbow: Secondary | ICD-10-CM | POA: Diagnosis not present

## 2022-08-12 DIAGNOSIS — M79672 Pain in left foot: Secondary | ICD-10-CM

## 2022-08-12 HISTORY — DX: Pain in left foot: M79.672

## 2022-08-25 DIAGNOSIS — M25661 Stiffness of right knee, not elsewhere classified: Secondary | ICD-10-CM | POA: Diagnosis not present

## 2022-08-25 DIAGNOSIS — M25561 Pain in right knee: Secondary | ICD-10-CM | POA: Diagnosis not present

## 2022-08-25 DIAGNOSIS — M1711 Unilateral primary osteoarthritis, right knee: Secondary | ICD-10-CM | POA: Diagnosis not present

## 2022-08-25 DIAGNOSIS — R262 Difficulty in walking, not elsewhere classified: Secondary | ICD-10-CM | POA: Diagnosis not present

## 2022-09-01 DIAGNOSIS — M1711 Unilateral primary osteoarthritis, right knee: Secondary | ICD-10-CM | POA: Diagnosis not present

## 2022-09-01 DIAGNOSIS — M25661 Stiffness of right knee, not elsewhere classified: Secondary | ICD-10-CM | POA: Diagnosis not present

## 2022-09-01 DIAGNOSIS — R262 Difficulty in walking, not elsewhere classified: Secondary | ICD-10-CM | POA: Diagnosis not present

## 2022-09-01 DIAGNOSIS — M25561 Pain in right knee: Secondary | ICD-10-CM | POA: Diagnosis not present

## 2022-09-03 ENCOUNTER — Encounter: Payer: Self-pay | Admitting: Physician Assistant

## 2022-09-03 ENCOUNTER — Ambulatory Visit (INDEPENDENT_AMBULATORY_CARE_PROVIDER_SITE_OTHER): Payer: Medicare Other | Admitting: Physician Assistant

## 2022-09-03 VITALS — BP 132/81 | HR 95 | Ht 70.5 in | Wt 184.4 lb

## 2022-09-03 DIAGNOSIS — G3184 Mild cognitive impairment, so stated: Secondary | ICD-10-CM

## 2022-09-03 NOTE — Progress Notes (Signed)
Assessment/Plan:   Mild Cognitive Impairment likely due to Alzheimer's Disease  Alcohol Abuse ***  Ronnie Gonzalez is a very pleasant 82 y.o. RH male  with a history of alcohol abuse, insomnia, OSA not on CPAP, peripheral polyneuropathy, hypertension, hyperlipidemia, CAD, anxiety, depression,  ***seen today for follow-up for mild cognitive impairment likely due to Alzheimer's disease and at risk for [name] given his confabulation and level of alcohol consumption.  He is on memantine 5 mg twice daily (he had diarrhea with donepezil) seen today in follow up for memory loss.       Follow up in   months. Continue Memantine 5  mg twice daily. Side effects were discussed  Discontinue alcohol, patient counseled Continue to control cardiovascular risk factors, continue Plavix 75 mg daily*** Patient is scheduled for repeat neuropsych evaluation on 10/2022 for clarity of the diagnosis and disease trajectory   Left Thalamic stroke likely secondary to Small Vessel Disease source   Patient was on Plavix 75 mg daily, but after 05/2022 hospitalization doe RLE DVT, ***    Subjective:    This patient is accompanied in the office by his wife*** who supplements the history.  Previous records as well as any outside records available were reviewed prior to todays visit. Patient was last seen on 8/223 at which time  MMSE  was 23/30 ***   Any changes in memory since last visit?" Memory is worse, concentration is bad"-wife says..Patient has some difficulty remembering recent conversations and people names, appointments  repeats oneself?  Endorsed by family, but patient denies  Disoriented when walking into a room?  Patient denies  Leaving objects in unusual places?"Started", R knee brace elsewhere, or stuff in the fridge   Wandering behavior?  denies   Any personality changes since last visit?  He continues to get frustrated when concentration "plays games", wife says it may be  worse  Any worsening depression?: "He is always happy" Hallucinations or paranoia?   Where are the bones in the window.  Seizures?    denies    Any sleep changes?  Denies vivid dreams, REM behavior or sleepwalking   Sleep apnea?  Endorsed, does not CPAP Any hygiene concerns?    He was taking twice a day, but now is less.   Independent of bathing and dressing?  Endorsed  Does the patient needs help with medications? Wife is in charge  Who is in charge of the finances? Son from [employer].  is in charge     Any changes in appetite?  denies    Patient have trouble swallowing?  denies  He likes to eat.  Does the patient cook? No  Any headaches?   denies   Chronic back pain  denies   Ambulates with difficulty?   Continues to play golf, and walks his dog Recent falls or head injuries? denies     Unilateral weakness, numbness or tingling?  Denies  Any tremors?  denies   Any anosmia?  Patient denies   Any incontinence of urine? Rarely, but refuses to wear pads or diapers   Any bowel dysfunction?   Denies Patient lives  Wife  Does the patient drive? Wife is the "copilot" the majority of times, denies getting lost  Alcohol?*** and sometimes tonic after dinner*** Alcohol free beer, 1 bottle of win ein 2 days    Initial Visit 05/08/21 The patient is seen in neurologic consultation at the request of Ronnie Dials, MD for the evaluation of memory.  The patient is accompanied by wife who supplements the history. This is a 82 y.o. year old male who has had memory issues for about one year, worse since February of this year.  His wife noted that he was making mistakes regarding appointments.  For example, he was called to confirm this appointment, and he told his wife that he has some appointment in [facility], but he could not remember who had call him what was there for.  He forgets quickly his instructions. His wife states that he is unable to carry a detailed conversation and if there are several  people in the conversation he becomes a listener, he does not enjoy partaking in it.  He will usually respond with generic one-liners.  When playing family games, he cannot follow below the directions of the game, but can certainly follow the directions if broken down into steps when his turn comes.  His memories of the past are skewed.  In the recent conversation regarding 911, he stated that he volunteered in the aftermath, which was not true. 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     He repeats the same questions, or stories.  His mood is good, denies any depression, or irritability.  He sleeps well with the help of sleep aids.  He denies any vivid dreams or sleepwalking, hallucinations or paranoia or leaving objects in unusual places.  He is independent of bathing and dressing, he is compliant with his medications, denies any issues with finances.  His appetite is good, denies trouble swallowing.  His wife always cooked.  He ambulates without difficulty without the use of a walker or a cane.  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  He continues to drive, but he may become lost at times.   He denies any headaches, double vision, dizziness, focal numbness or tingling, unilateral weakness or tremors, urine incontinence or retention, denies constipation or diarrhea.  Denies anosmia.  Denies a history of sleep apnea.  He continues to drink 2 large cans of beer a day, as well as a repetitive after dinner.  He denies tobacco abuse.  Family history remarkable for one uncle, father and grandfather with dementia. Neuropsychological exam prior to this appointment (12/01/20) showed significant memory problems likely due to storage problem, concerning for mild cognitive impairment  being at risk for Alzheimer's clinical syndrome with questionable component of Warnicke-Korsakoff given his confabulation and level of alcohol consumption.    Left thalamic stroke likely secondary to small vessel disease source   Patient presented with entire right upper extremity and right lower extremity below the level of the knee tingling for several weeks, without facial involvement, or weakness.  He denies vision changes or problems with speech.  He never had a stroke before. Code stroke was called and CT of the head showed no acute abnormalities.  CT angio of the head and neck was negative for large vessel occlusion, mild for age atheromatous change about the carotid bifurcations and carotid siphons without hemodynamically significant or correctable stenosis was noted.  MRI of the brain was remarkable for 1.5 cm acute ischemic nonhemorrhagic left thalamic infarct.  2D echo showed normal EF between 55 and 60%, with normal function.  LDL was 88, Crestor 20 mg daily was added, with goal of LDL less than 70.  He was also placed on DAPT for 3 weeks and then was supposed to be on Plavix alone thereafter but he resumed ASA alone instead.  Because of numbness in his right leg, the patient should not drive.       Labs  04/29/21 LDL 101, TSH 0.86, MCV 95.5  o/w nl CBC, B1 240.09, B12 352   MRI brain wo contrast 05/09/21 No evidence of acute intracranial abnormality. Mild chronic small vessel ischemic changes within the cerebral white matter.Moderate generalized cerebral atrophy. Comparatively mild cerebellar  atrophy. Signal abnormality is questioned within the distal cervical right internal carotid artery (at the very caudal extent of the field of view).   CT angio neck w and wo contrast 05/14/21 No hemodynamically significant stenosis in the neck.   PREVIOUS MEDICATIONS: donepezil (diarrhea)  CURRENT MEDICATIONS:  Outpatient Encounter Medications as of 09/03/2022  Medication Sig   Ascorbic Acid  (VITAMIN C) 1000 MG tablet Take 1,000 mg by mouth daily.   Cholecalciferol (VITAMIN D3 SUPER STRENGTH) 50 MCG (2000 UT) CAPS Take 2,000 Units by mouth daily.   clopidogrel (PLAVIX) 75 MG tablet Take 1 tablet (75 mg total) by mouth daily.   Cyanocobalamin (VITAMIN B-12) 2500 MCG SUBL Place 2,500 mcg under the tongue daily.   GLUCOSAMINE-CHONDROITIN PO Take 1 capsule by mouth daily.   Magnesium 250 MG TABS Take 250 mg by mouth daily.   memantine (NAMENDA) 10 MG tablet Take 1 tablet 2 times a day   Multiple Vitamin (MULTIVITAMIN WITH MINERALS) TABS Take 1 tablet by mouth daily.   Omega-3 Fatty Acids (FISH OIL) 1000 MG CPDR Take 1,000 mg by mouth daily.   rosuvastatin (CRESTOR) 20 MG tablet Take 1 tablet (20 mg total) by mouth daily.   vitamin E 180 MG (400 UNITS) capsule Take 400 Units by mouth daily.   zinc gluconate 50 MG tablet Take 50 mg by mouth daily.   No facility-administered encounter medications on file as of 09/03/2022.       03/03/2022   12:00 PM 12/09/2021    9:00 AM 11/24/2020    9:00 AM  MMSE - Mini Mental State Exam  Orientation to time '1 4 5  '$ Orientation to Place '5 5 5  '$ Registration '3 3 3  '$ Attention/ Calculation '5 4 4  '$ Recall 0 0 0  Language- name 2 objects '2 2 2  '$ Language- repeat '1 1 1  '$ Language- follow 3 step command '3 3 3  '$ Language- read & follow direction '1 1 1  '$ Write a sentence '1 1 1  '$ Copy design 1 1 0  Total score '23 25 25       '$ No data to display          Objective:     PHYSICAL EXAMINATION:    VITALS:  There were no vitals filed for this visit.  GEN:  The patient appears stated age and is in NAD. HEENT:  Normocephalic, atraumatic.   Neurological examination:  General: NAD, well-groomed, appears stated age. Orientation: The patient is alert. Oriented to person, place and date Cranial nerves: There is good facial symmetry.The speech is fluent and clear. No aphasia or dysarthria. Fund of knowledge is appropriate. Recent and remote memory are  impaired. Attention and concentration are reduced.  Able to name objects and repeat phrases.  Hearing is intact to conversational tone.    Sensation: Sensation is intact to light touch  throughout Motor: Strength is at least antigravity x4. DTR's 2/4 in UE/LE     Movement examination: Tone: There is normal tone in the UE/LE Abnormal movements:  no tremor.  No myoclonus.  No asterixis.   Coordination:  There is no decremation with RAM's. Normal finger to nose  Gait and Station: The patient has no difficulty arising out of a deep-seated chair without the use of the hands. The patient's stride length is good.  Gait is cautious and narrow.    Thank you for allowing [place] the opportunity to participate in the care of this nice patient. Please do not hesitate to contact us for any questions or concerns.   Total time spent on today's visit was *** minutes dedicated to this patient today, preparing to see patient, examining the patient, ordering tests and/or medications and counseling the patient, documenting clinical information in the EHR or other health record, independently interpreting results and communicating results to the patient/family, discussing treatment and goals, answering patient's questions and coordinating care.  Cc:  Sheppard Coil [facility] 09/03/2022 6:34 AM

## 2022-09-03 NOTE — Patient Instructions (Signed)
It was a pleasure to see you today at our office.   Recommendations:  Follow up in 6  months Repeat neurocognitive testing  Increase memantine to 10 mg 2 times a day  Discontinue alcohol  Monitor driving Continue Plavix 75 mg a day   Whom to call:  Memory  decline, memory medications: Call our office [phone]   For psychiatric meds, mood meds: Please have your primary care physician manage these medications.   Counseling regarding caregiver distress, including caregiver depression, anxiety and issues regarding community resources, adult day care programs, adult living facilities, or memory care questions:   Feel free to contact [place], Social Worker at [phone]   For assessment of decision of mental capacity and competency:  Call Dr. Anthoney Harada, geriatric psychiatrist at [phone]  For guidance in geriatric dementia issues please call [facility] [phone]  For guidance regarding [facility] and if placement were needed at the facility, contact Arnell Asal, Social Worker tel: [phone]  If you have any severe symptoms of a stroke, or other severe issues such as confusion,severe chills or fever, etc call 911 or go to the ER as you may need to be evaluated further   Feel free to visit Facebook page " [email or web address]" for tips of how to care for people with memory problems.     RECOMMENDATIONS FOR ALL PATIENTS WITH MEMORY PROBLEMS: 1. Continue to exercise (Recommend 30 minutes of walking everyday, or 3 hours every week) 2. Increase social interactions - continue going to [place] and enjoy social gatherings with friends and family 3. Eat healthy, avoid fried foods and eat more fruits and vegetables 4. Maintain adequate blood pressure, blood sugar, and blood cholesterol level. Reducing the risk of stroke and cardiovascular disease also helps promoting better memory. 5. Avoid stressful situations. Live a simple life and avoid  aggravations. Organize your time and prepare for the next day in anticipation. 6. Sleep well, avoid any interruptions of sleep and avoid any distractions in the bedroom that may interfere with adequate sleep quality 7. Avoid sugar, avoid sweets as there is a strong link between excessive sugar intake, diabetes, and cognitive impairment We discussed the Mediterranean diet, which has been shown to help patients reduce the risk of progressive memory disorders and reduces cardiovascular risk. This includes eating fish, eat fruits and green leafy vegetables, nuts like almonds and hazelnuts, walnuts, and also use olive oil. Avoid fast foods and fried foods as much as possible. Avoid sweets and sugar as sugar use has been linked to worsening of memory function.  There is always a concern of gradual progression of memory problems. If this is the case, then we may need to adjust level of care according to patient needs. Support, both to the patient and caregiver, should then be put into place.    FALL PRECAUTIONS: Be cautious when walking. Scan the area for obstacles that may increase the risk of trips and falls. When getting up in the mornings, sit up at the edge of the bed for a few minutes before getting out of bed. Consider elevating the bed at the head end to avoid drop of blood pressure when getting up. Walk always in a well-lit room (use night lights in the walls). Avoid area rugs or power cords from appliances in the middle of the walkways. Use a walker or a cane if necessary and consider physical therapy for balance exercise. Get your eyesight checked regularly.  FINANCIAL OVERSIGHT: Supervision, especially oversight  when making financial decisions or transactions is also recommended.  HOME SAFETY: Consider the safety of the kitchen when operating appliances like stoves, microwave oven, and blender. Consider having supervision and share cooking responsibilities until no longer able to participate in  those. Accidents with firearms and other hazards in the house should be identified and addressed as well.   ABILITY TO BE LEFT ALONE: If patient is unable to contact 911 operator, consider using [employer], or when the need is there, arrange for someone to stay with patients. Smoking is a fire hazard, consider supervision or cessation. Risk of wandering should be assessed by caregiver and if detected at any point, supervision and safe proof recommendations should be instituted.  MEDICATION SUPERVISION: Inability to self-administer medication needs to be constantly addressed. Implement a mechanism to ensure safe administration of the medications.   DRIVING: Regarding driving, in patients with progressive memory problems, driving will be impaired. We advise to have someone else do the driving if trouble finding directions or if minor accidents are reported. Independent driving assessment is available to determine safety of driving.   If you are interested in the driving assessment, you can contact the following:  [employer] in [place]  [place] [phone]  [place]  [facility] [phone] or [phone]

## 2022-09-08 DIAGNOSIS — D225 Melanocytic nevi of trunk: Secondary | ICD-10-CM | POA: Diagnosis not present

## 2022-09-08 DIAGNOSIS — L570 Actinic keratosis: Secondary | ICD-10-CM | POA: Diagnosis not present

## 2022-09-08 DIAGNOSIS — L821 Other seborrheic keratosis: Secondary | ICD-10-CM | POA: Diagnosis not present

## 2022-09-08 DIAGNOSIS — D1800 Hemangioma unspecified site: Secondary | ICD-10-CM | POA: Diagnosis not present

## 2022-09-08 DIAGNOSIS — Z86018 Personal history of other benign neoplasm: Secondary | ICD-10-CM | POA: Diagnosis not present

## 2022-09-08 DIAGNOSIS — R262 Difficulty in walking, not elsewhere classified: Secondary | ICD-10-CM | POA: Diagnosis not present

## 2022-09-08 DIAGNOSIS — Z85828 Personal history of other malignant neoplasm of skin: Secondary | ICD-10-CM | POA: Diagnosis not present

## 2022-09-08 DIAGNOSIS — L578 Other skin changes due to chronic exposure to nonionizing radiation: Secondary | ICD-10-CM | POA: Diagnosis not present

## 2022-09-08 DIAGNOSIS — D2271 Melanocytic nevi of right lower limb, including hip: Secondary | ICD-10-CM | POA: Diagnosis not present

## 2022-09-08 DIAGNOSIS — M25661 Stiffness of right knee, not elsewhere classified: Secondary | ICD-10-CM | POA: Diagnosis not present

## 2022-09-08 DIAGNOSIS — M1711 Unilateral primary osteoarthritis, right knee: Secondary | ICD-10-CM | POA: Diagnosis not present

## 2022-09-08 DIAGNOSIS — M25561 Pain in right knee: Secondary | ICD-10-CM | POA: Diagnosis not present

## 2022-09-08 DIAGNOSIS — Z808 Family history of malignant neoplasm of other organs or systems: Secondary | ICD-10-CM | POA: Diagnosis not present

## 2022-09-15 DIAGNOSIS — M1711 Unilateral primary osteoarthritis, right knee: Secondary | ICD-10-CM | POA: Diagnosis not present

## 2022-09-15 DIAGNOSIS — R262 Difficulty in walking, not elsewhere classified: Secondary | ICD-10-CM | POA: Diagnosis not present

## 2022-09-15 DIAGNOSIS — M25561 Pain in right knee: Secondary | ICD-10-CM | POA: Diagnosis not present

## 2022-09-15 DIAGNOSIS — M25661 Stiffness of right knee, not elsewhere classified: Secondary | ICD-10-CM | POA: Diagnosis not present

## 2022-10-04 ENCOUNTER — Encounter: Payer: Self-pay | Admitting: Psychology

## 2022-10-04 DIAGNOSIS — I2510 Atherosclerotic heart disease of native coronary artery without angina pectoris: Secondary | ICD-10-CM | POA: Insufficient documentation

## 2022-10-04 DIAGNOSIS — E785 Hyperlipidemia, unspecified: Secondary | ICD-10-CM | POA: Insufficient documentation

## 2022-10-04 DIAGNOSIS — G4733 Obstructive sleep apnea (adult) (pediatric): Secondary | ICD-10-CM | POA: Insufficient documentation

## 2022-10-04 DIAGNOSIS — G629 Polyneuropathy, unspecified: Secondary | ICD-10-CM | POA: Insufficient documentation

## 2022-10-04 DIAGNOSIS — I10 Essential (primary) hypertension: Secondary | ICD-10-CM | POA: Insufficient documentation

## 2022-10-05 ENCOUNTER — Ambulatory Visit: Payer: Medicare Other

## 2022-10-05 ENCOUNTER — Encounter: Payer: Self-pay | Admitting: Psychology

## 2022-10-05 ENCOUNTER — Ambulatory Visit (INDEPENDENT_AMBULATORY_CARE_PROVIDER_SITE_OTHER): Payer: Medicare Other | Admitting: Psychology

## 2022-10-05 DIAGNOSIS — F0280 Dementia in other diseases classified elsewhere without behavioral disturbance: Secondary | ICD-10-CM

## 2022-10-05 DIAGNOSIS — G309 Alzheimer's disease, unspecified: Secondary | ICD-10-CM | POA: Diagnosis not present

## 2022-10-05 DIAGNOSIS — R4189 Other symptoms and signs involving cognitive functions and awareness: Secondary | ICD-10-CM

## 2022-10-05 HISTORY — DX: Dementia in other diseases classified elsewhere, unspecified severity, without behavioral disturbance, psychotic disturbance, mood disturbance, and anxiety: F02.80

## 2022-10-05 NOTE — Progress Notes (Signed)
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 evaluation. Oral consent to participate was provided by the patient.   This evaluation was conducted by Christia Reading, Ph.D., ABPP-CN, board certified clinical neuropsychologist. Mr. Guttilla completed a clinical interview with Dr. Melvyn Novas, billed as one unit [phone], and 140 minutes of cognitive testing and scoring, billed as one unit [phone] and four additional units 96139. Psychometrist Cruzita Lederer, B.S., assisted Dr. Melvyn Novas with test administration and scoring procedures. As a separate and discrete service, Dr. Melvyn Novas spent a total of 160 minutes in interpretation and report writing billed as one unit [phone] and two units 96133.

## 2022-10-05 NOTE — Progress Notes (Signed)
   Psychometrician Note   Cognitive testing was administered to Ronnie Gonzalez by Cruzita Lederer, B.S. (psychometrist) under the supervision of Dr. Christia Reading, Ph.D., licensed psychologist on 10/05/2022. Ronnie Gonzalez did not appear overtly distressed by the testing session per behavioral observation or responses across self-report questionnaires. Rest breaks were offered.    The battery of tests administered was selected by Dr. Christia Reading, Ph.D. with consideration to Ronnie Gonzalez current level of functioning, the nature of his symptoms, emotional and behavioral responses during interview, level of literacy, observed level of motivation/effort, and the nature of the referral question. This battery was communicated to the psychometrist. Communication between Dr. Christia Reading, Ph.D. and the psychometrist was ongoing throughout the evaluation and Dr. Christia Reading, Ph.D. was immediately accessible at all times. Dr. Christia Reading, Ph.D. provided supervision to the psychometrist on the date of this service to the extent necessary to assure the quality of all services provided.    Ronnie Gonzalez will return within approximately 1-2 weeks for an interactive feedback session with Dr. Melvyn Novas at which time his test performances, clinical impressions, and treatment recommendations will be reviewed in detail. Ronnie Gonzalez understands he can contact our office should he require our assistance before this time.  A total of 140 minutes of billable time were spent face-to-face with Ronnie Gonzalez by the psychometrist. This includes both test administration and scoring time. Billing for these services is reflected in the clinical report generated by Dr. Christia Reading, Ph.D.  This note reflects time spent with the psychometrician and does not include test scores or any clinical interpretations made by Dr. Melvyn Novas. The full report will follow in a separate note.

## 2022-10-13 ENCOUNTER — Ambulatory Visit (INDEPENDENT_AMBULATORY_CARE_PROVIDER_SITE_OTHER): Payer: Medicare Other | Admitting: Psychology

## 2022-10-13 DIAGNOSIS — I639 Cerebral infarction, unspecified: Secondary | ICD-10-CM

## 2022-10-13 DIAGNOSIS — F0280 Dementia in other diseases classified elsewhere without behavioral disturbance: Secondary | ICD-10-CM

## 2022-10-13 DIAGNOSIS — G309 Alzheimer's disease, unspecified: Secondary | ICD-10-CM

## 2022-10-13 NOTE — Progress Notes (Signed)
   Neuropsychology Feedback Session [name] of Neurology  Reason for Referral:   Ronnie Gonzalez is a 82 y.o. right-handed Caucasian male referred by Sharene Butters, PA-C, to characterize his current cognitive functioning and assist with diagnostic clarity and treatment planning in the context of a prior amnestic MCI diagnosis with concerns for underlying Alzheimer's disease.   Feedback:   [name] completed a comprehensive neuropsychological evaluation on 10/05/2022. Please refer to that encounter for the full report and recommendations. Briefly, results suggested severe impairment surrounding all aspects of delayed retrieval and recognition/consolidation aspects of memory. Additional weaknesses were exhibited across processing speed and executive functioning, while performance variability was exhibited across both encoding (i.e., learning) aspects of memory and confrontation naming. Relative to his previous neuropsychological evaluation in April 2022, mild declines were seen across memory abilities, particularly retention rates after brief delays. Regarding the underlying etiology, I share Dr. Les Pou previous concerns surrounding Alzheimer's disease and unfortunately feel that this is the most likely explanation for ongoing impairment. Despite showing an ability to learn some verbal information adequately, he was fully amnestic (i.e., 0% retention) across all memory tasks after a brief delay. He also performed very poorly across yes/no recognition trials. Together, this suggests evidence for rapid forgetting, as well as a quite pronounced storage impairment, both of which are the hallmark characteristics of this illness. Additional variability across confrontation naming, as well as a discrepancy between phonemic and semantic fluency (with worse performances across the latter) are further consistent with typical disease progression.   [name]  was accompanied by his wife during the current feedback session. Content of the current session focused on the results of his neuropsychological evaluation. [name] was given the opportunity to ask questions and his questions were answered. He was encouraged to reach out should additional questions arise. A copy of his report was provided at the conclusion of the visit.      Greater than 31 minutes were spent preparing for, conducting, and documenting the current feedback session with [name], billed as one unit [phone].

## 2023-01-04 ENCOUNTER — Other Ambulatory Visit: Payer: Self-pay | Admitting: Physician Assistant

## 2023-01-14 DIAGNOSIS — E785 Hyperlipidemia, unspecified: Secondary | ICD-10-CM | POA: Diagnosis not present

## 2023-01-14 DIAGNOSIS — I6381 Other cerebral infarction due to occlusion or stenosis of small artery: Secondary | ICD-10-CM | POA: Diagnosis not present

## 2023-01-14 DIAGNOSIS — R4189 Other symptoms and signs involving cognitive functions and awareness: Secondary | ICD-10-CM | POA: Diagnosis not present

## 2023-02-04 DIAGNOSIS — D3131 Benign neoplasm of right choroid: Secondary | ICD-10-CM | POA: Diagnosis not present

## 2023-02-04 DIAGNOSIS — D3132 Benign neoplasm of left choroid: Secondary | ICD-10-CM | POA: Diagnosis not present

## 2023-02-04 DIAGNOSIS — H353131 Nonexudative age-related macular degeneration, bilateral, early dry stage: Secondary | ICD-10-CM | POA: Diagnosis not present

## 2023-02-20 ENCOUNTER — Other Ambulatory Visit: Payer: Self-pay | Admitting: Physician Assistant

## 2023-03-04 ENCOUNTER — Ambulatory Visit (INDEPENDENT_AMBULATORY_CARE_PROVIDER_SITE_OTHER): Payer: Medicare Other | Admitting: Physician Assistant

## 2023-03-04 ENCOUNTER — Encounter: Payer: Self-pay | Admitting: Physician Assistant

## 2023-03-04 VITALS — BP 130/71 | HR 89 | Resp 18 | Ht 70.5 in | Wt 187.0 lb

## 2023-03-04 DIAGNOSIS — G309 Alzheimer's disease, unspecified: Secondary | ICD-10-CM

## 2023-03-04 DIAGNOSIS — I639 Cerebral infarction, unspecified: Secondary | ICD-10-CM

## 2023-03-04 DIAGNOSIS — F0280 Dementia in other diseases classified elsewhere without behavioral disturbance: Secondary | ICD-10-CM

## 2023-03-04 MED ORDER — RIVASTIGMINE TARTRATE 1.5 MG PO CAPS: ORAL_CAPSULE | ORAL | 11 refills | Status: DC

## 2023-03-04 NOTE — Patient Instructions (Signed)
It was a pleasure to see you today at our office.   Recommendations:  Follow up in 6  months Continue memantine 10 mg 2 times a day  Start rivastigmine  1.5 milligrams.  Take 1  capsule Nightly for 2 weeks then increase to 1 cap twice daily if tolerated.  Monitor driving Continue Plavix 75 mg a day   Whom to call:  Memory  decline, memory medications: Call our office [phone]   For psychiatric meds, mood meds: Please have your primary care physician manage these medications.    For assessment of decision of mental capacity and competency:  Call Dr. Erick Blinks, geriatric psychiatrist at [phone]  For guidance in geriatric dementia issues please call [facility] [phone]  For guidance regarding [facility] and if placement were needed at the facility, contact Sidney Ace, Social Worker tel: [phone]  If you have any severe symptoms of a stroke, or other severe issues such as confusion,severe chills or fever, etc call 911 or go to the ER as you may need to be evaluated further      RECOMMENDATIONS FOR ALL PATIENTS WITH MEMORY PROBLEMS: 1. Continue to exercise (Recommend 30 minutes of walking everyday, or 3 hours every week) 2. Increase social interactions - continue going to [place] and enjoy social gatherings with friends and family 3. Eat healthy, avoid fried foods and eat more fruits and vegetables 4. Maintain adequate blood pressure, blood sugar, and blood cholesterol level. Reducing the risk of stroke and cardiovascular disease also helps promoting better memory. 5. Avoid stressful situations. Live a simple life and avoid aggravations. Organize your time and prepare for the next day in anticipation. 6. Sleep well, avoid any interruptions of sleep and avoid any distractions in the bedroom that may interfere with adequate sleep quality 7. Avoid sugar, avoid sweets as there is a strong link between excessive sugar intake,  diabetes, and cognitive impairment We discussed the Mediterranean diet, which has been shown to help patients reduce the risk of progressive memory disorders and reduces cardiovascular risk. This includes eating fish, eat fruits and green leafy vegetables, nuts like almonds and hazelnuts, walnuts, and also use olive oil. Avoid fast foods and fried foods as much as possible. Avoid sweets and sugar as sugar use has been linked to worsening of memory function.  There is always a concern of gradual progression of memory problems. If this is the case, then we may need to adjust level of care according to patient needs. Support, both to the patient and caregiver, should then be put into place.    FALL PRECAUTIONS: Be cautious when walking. Scan the area for obstacles that may increase the risk of trips and falls. When getting up in the mornings, sit up at the edge of the bed for a few minutes before getting out of bed. Consider elevating the bed at the head end to avoid drop of blood pressure when getting up. Walk always in a well-lit room (use night lights in the walls). Avoid area rugs or power cords from appliances in the middle of the walkways. Use a walker or a cane if necessary and consider physical therapy for balance exercise. Get your eyesight checked regularly.  FINANCIAL OVERSIGHT: Supervision, especially oversight when making financial decisions or transactions is also recommended.  HOME SAFETY: Consider the safety of the kitchen when operating appliances like stoves, microwave oven, and blender. Consider having supervision and share cooking responsibilities until no longer able to participate in those.  Accidents with firearms and other hazards in the house should be identified and addressed as well.   ABILITY TO BE LEFT ALONE: If patient is unable to contact 911 operator, consider using [facility], or when the need is there, arrange for someone to stay with patients. Smoking is a fire hazard,  consider supervision or cessation. Risk of wandering should be assessed by caregiver and if detected at any point, supervision and safe proof recommendations should be instituted.  MEDICATION SUPERVISION: Inability to self-administer medication needs to be constantly addressed. Implement a mechanism to ensure safe administration of the medications.   DRIVING: Regarding driving, in patients with progressive memory problems, driving will be impaired. We advise to have someone else do the driving if trouble finding directions or if minor accidents are reported. Independent driving assessment is available to determine safety of driving.   If you are interested in the driving assessment, you can contact the following:  [facility] [employer] [facility] in [place] [phone]  [facility] [phone]  [facility] [phone]  [facility] [phone] or [phone]

## 2023-03-04 NOTE — Progress Notes (Signed)
Assessment/Plan:   Dementia due to Alzheimer's Disease     Ronnie Gonzalez is a very pleasant 82 y.o. RH male with a history of  with a history of alcohol abuse, insomnia, OSA not on CPAP, peripheral polyneuropathy, hypertension, hyperlipidemia, CAD, anxiety, depression and a diagnosis of dementia due to Alzheimer's disease  as per Neuropsych evaluation 10/2022, seen today in follow up for memory loss. Patient is currently on memantine 10 mg bid (had SE with donepezil) . In view of memory decline, discussed starting rivastigmine if tolerated, wife and patient agreed.      Follow up in  6 months. Continue Memantine 10 mg twice daily. Side effects were discussed  Start rivastigmine  mg. Take 1 Nightly for 2 weeks then increase to 1 cap twice daily if tolerated.  Side effects discussed  Recommend good control of her cardiovascular risk factors.  Continue Plavix 75 mg daily Continue to control mood as per PCP Monitor driving   Left Thalamic stroke likely secondary to Small Vessel Disease source, history of RLE DVT  No recurrence of any stroke-like symptoms   Continue Plavix 75 mg daily. Side effects discussed  Recommend good control of cardiovascular risk factors.       Subjective:    This patient is accompanied in the office by his wife  who supplements the history.  Previous records as well as any outside records available were reviewed prior to todays visit. Patient was last seen on 09/03/2022   Any changes in memory since last visit? "  Memory and concentration are worse " Wife says.  He does have more difficulty remembering recent conversations and names of people and appointments. Repeats oneself?  Endorsed Disoriented when walking into a room?  Patient denies    Leaving objects in unusual places?  Endorsed.    Wandering behavior?  "He got lost twice with his dog, I could not find him in the park".   Any personality changes since last visit?  denies   Any worsening  depression?:  Denies.   Hallucinations or paranoia? No  Seizures? denies    Any sleep changes?  Sleeps well. Denies vivid dreams, REM behavior or sleepwalking   Sleep apnea?  Endorsed, does not use CPAP. Any hygiene concerns?  As before, showers are less frequent. Independent of bathing and dressing?  Endorsed  Does the patient needs help with medications?  Daughter and Wife in charge   Who is in charge of the finances?  Son  from [place] is in charge     Any changes in appetite?  denies     Patient have trouble swallowing? Denies.   Does the patient cook? No Any headaches?   denies   Chronic back pain  denies    Ambulates with difficulty? Denies.  He still likes to walk his dog, no longer plays golf Recent falls or head injuries? denies     Unilateral weakness, numbness or tingling? denies   Any tremors?  Denies   Any anosmia?  Denies   Any incontinence of urine?  Endorsed, but refuses to wear pads or diapers.   Any bowel dysfunction?   Denies      Patient lives with his wife  Does the patient drive? Only with his wife.  Alcohol?   alcohol free beer and wine   Initial Visit 05/08/21 The patient is seen in neurologic consultation at the request of Henrine Screws, MD for the evaluation of memory.  The patient is accompanied by wife  who supplements the history. This is a 82 y.o. year old male who has had memory issues for about one year, worse since February of this year.  His wife noted that he was making mistakes regarding appointments.  For example, he was called to confirm this appointment, and he told his wife that he has some appointment in [facility], but he could not remember who had call him what was there for.  He forgets quickly his instructions. His wife states that he is unable to carry a detailed conversation and if there are several people in the conversation he becomes a listener, he does not enjoy partaking in it.  He will usually respond with generic one-liners.   When playing family games, he cannot follow below the directions of the game, but can certainly follow the directions if broken down into steps when his turn comes.  His memories of the past are skewed.  In the recent conversation regarding 911, he stated that he volunteered in the aftermath, which was not true. He then agreed that he was not there ( he is a retired Conservation officer, historic buildings).  He occasionally leaves the house to run an errand "returning home empty handed because he forgot what was the reason he was leaving the house in the first place".     He repeats the same questions, or stories.  His mood is good, denies any depression, or irritability.  He sleeps well with the help of sleep aids.  He denies any vivid dreams or sleepwalking, hallucinations or paranoia or leaving objects in unusual places.  He is independent of bathing and dressing, he is compliant with his medications, denies any issues with finances.  His appetite is good, denies trouble swallowing.  His wife always cooked.  He ambulates without difficulty without the use of a walker or a cane.  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  He continues to drive, but he may become lost at times.   He denies any headaches, double vision, dizziness, focal numbness or tingling, unilateral weakness or tremors, urine incontinence or retention, denies constipation or diarrhea.  Denies anosmia.  Denies a history of sleep apnea.  He continues to drink 2 large cans of beer a day, as well as a repetitive after dinner.  He denies tobacco abuse.  Family history remarkable for one uncle, father and grandfather with dementia. Neuropsychological exam prior to this appointment (12/01/20) showed significant memory problems likely due to storage problem, concerning for mild cognitive impairment being at risk for Alzheimer's clinical syndrome with questionable component of Warnicke-Korsakoff given his confabulation and level of  alcohol consumption.    Left thalamic stroke likely secondary to small vessel disease source   Patient presented with entire right upper extremity and right lower extremity below the level of the knee tingling for several weeks, without facial involvement, or weakness.  He denies vision changes or problems with speech.  He never had a stroke before. Code stroke was called and CT of the head showed no acute abnormalities.  CT angio of the head and neck was negative for large vessel occlusion, mild for age atheromatous change about the carotid bifurcations and carotid siphons without hemodynamically significant or correctable stenosis was noted.  MRI of the brain was remarkable for 1.5 cm acute ischemic nonhemorrhagic left thalamic infarct.  2D echo showed normal EF between 55 and 60%, with normal function.  LDL was 88, Crestor 20 mg  daily was added, with goal of LDL less than 70.  He was also placed on DAPT for 3 weeks and then was supposed to be on Plavix alone thereafter but he resumed ASA alone instead.  Because of numbness in his right leg, the patient should not drive.       Labs  04/29/21 LDL 101, TSH 0.86, MCV 95.5  o/w nl CBC, B1 240.09, B12 352   MRI brain wo contrast 05/09/21 No evidence of acute intracranial abnormality. Mild chronic small vessel ischemic changes within the cerebral white matter.Moderate generalized cerebral atrophy. Comparatively mild cerebellar  atrophy. Signal abnormality is questioned within the distal cervical right internal carotid artery (at the very caudal extent of the field of view).    CT angio neck w and wo contrast 05/14/21 No hemodynamically significant stenosis in the neck.  PREVIOUS MEDICATIONS: Donepezil (diarrhea)   Neuropsychological evaluation 10/05/2022. Briefly, results suggested severe impairment surrounding all aspects of delayed retrieval and recognition/consolidation aspects of memory. Additional weaknesses were exhibited across processing speed and  executive functioning, while performance variability was exhibited across both encoding (i.e., learning) aspects of memory and confrontation naming. Relative to his previous neuropsychological evaluation in April 2022, mild declines were seen across memory abilities, particularly retention rates after brief delays. Regarding the underlying etiology, I share Dr. Marca Ancona previous concerns surrounding Alzheimer's disease and unfortunately feel that this is the most likely explanation for ongoing impairment. Despite showing an ability to learn some verbal information adequately, he was fully amnestic (i.e., 0% retention) across all memory tasks after a brief delay. He also performed very poorly across yes/no recognition trials. Together, this suggests evidence for rapid forgetting, as well as a quite pronounced storage impairment, both of which are the hallmark characteristics of this illness. Additional variability across confrontation naming, as well as a discrepancy between phonemic and semantic fluency (with worse performances across the latter) are further consistent with typical disease progression.     CURRENT MEDICATIONS:  Outpatient Encounter Medications as of 03/04/2023  Medication Sig   Ascorbic Acid (VITAMIN C) 1000 MG tablet Take 1,000 mg by mouth daily.   Cholecalciferol (VITAMIN D3 SUPER STRENGTH) 50 MCG (2000 UT) CAPS Take 2,000 Units by mouth daily.   clopidogrel (PLAVIX) 75 MG tablet TAKE 1 TABLET BY MOUTH EVERY DAY FOR 90 DAYS   Cyanocobalamin (VITAMIN B-12) 2500 MCG SUBL Place 2,500 mcg under the tongue daily.   GLUCOSAMINE-CHONDROITIN PO Take 1 capsule by mouth daily.   Magnesium 250 MG TABS Take 250 mg by mouth daily.   memantine (NAMENDA) 10 MG tablet TAKE 1 TABLET BY MOUTH TWICE A DAY   Multiple Vitamin (MULTIVITAMIN WITH MINERALS) TABS Take 1 tablet by mouth daily.   Omega-3 Fatty Acids (FISH OIL) 1000 MG CPDR Take 1,000 mg by mouth daily.   rivastigmine (EXELON) 1.5 MG capsule  Take one capsule at night for 2 weeks and then 1 capsule twice a day   vitamin E 180 MG (400 UNITS) capsule Take 400 Units by mouth daily.   zinc gluconate 50 MG tablet Take 50 mg by mouth daily.   rosuvastatin (CRESTOR) 20 MG tablet Take 1 tablet (20 mg total) by mouth daily.   No facility-administered encounter medications on file as of 03/04/2023.       03/03/2022   12:00 PM 12/09/2021    9:00 AM 11/24/2020    9:00 AM  MMSE - Mini Mental State Exam  Orientation to time 1 4 5   Orientation to Place 5  5 5  Registration 3 3 3   Attention/ Calculation 5 4 4   Recall 0 0 0  Language- name 2 objects 2 2 2   Language- repeat 1 1 1   Language- follow 3 step command 3 3 3   Language- read & follow direction 1 1 1   Write a sentence 1 1 1   Copy design 1 1 0  Total score 23 25 25        No data to display          Objective:     PHYSICAL EXAMINATION:    VITALS:   Vitals:   03/04/23 0840  BP: 130/71  Pulse: 89  Resp: 18  SpO2: 96%  Weight: 187 lb (84.8 kg)  Height: 5' 10.5" (1.791 m)    GEN:  The patient appears stated age and is in NAD. HEENT:  Normocephalic, atraumatic.   Neurological examination:  General: NAD, well-groomed, appears stated age. Orientation: The patient is alert. Oriented to person, place and not to date Cranial nerves: There is good facial symmetry.The speech is fluent and clear. No aphasia or dysarthria. Fund of knowledge is appropriate. Recent and remote memory are impaired. Attention and concentration are reduced.  Able to name objects and repeat phrases.  Hearing is intact to conversational tone.   Sensation: Sensation is intact to light touch throughout Motor: Strength is at least antigravity x4. DTR's 2/4 in UE/LE     Movement examination: Tone: There is normal tone in the UE/LE, no cogwheeling Abnormal movements:  slight L hand tremor.  No myoclonus.  No asterixis.   Coordination:  There is no decremation with RAM's. Normal finger to nose  Gait  and Station: The patient has no difficulty arising out of a deep-seated chair without the use of the hands. The patient's stride length is good.  Gait is cautious and narrow.    Thank you for allowing [place] the opportunity to participate in the care of this nice patient. Please do not hesitate to contact us for any questions or concerns.   Total time spent on today's visit was 30 minutes dedicated to this patient today, preparing to see patient, examining the patient, ordering tests and/or medications and counseling the patient, documenting clinical information in the EHR or other health record, independently interpreting results and communicating results to the patient/family, discussing treatment and goals, answering patient's questions and coordinating care.  Cc:  Resa Miner [facility] 03/04/2023 11:26 AM

## 2023-03-16 DIAGNOSIS — M25661 Stiffness of right knee, not elsewhere classified: Secondary | ICD-10-CM | POA: Diagnosis not present

## 2023-03-16 DIAGNOSIS — M25561 Pain in right knee: Secondary | ICD-10-CM | POA: Diagnosis not present

## 2023-03-16 DIAGNOSIS — M1711 Unilateral primary osteoarthritis, right knee: Secondary | ICD-10-CM | POA: Diagnosis not present

## 2023-03-16 DIAGNOSIS — R262 Difficulty in walking, not elsewhere classified: Secondary | ICD-10-CM | POA: Diagnosis not present

## 2023-03-23 DIAGNOSIS — M25561 Pain in right knee: Secondary | ICD-10-CM | POA: Diagnosis not present

## 2023-03-23 DIAGNOSIS — M25661 Stiffness of right knee, not elsewhere classified: Secondary | ICD-10-CM | POA: Diagnosis not present

## 2023-03-23 DIAGNOSIS — R262 Difficulty in walking, not elsewhere classified: Secondary | ICD-10-CM | POA: Diagnosis not present

## 2023-03-23 DIAGNOSIS — M1711 Unilateral primary osteoarthritis, right knee: Secondary | ICD-10-CM | POA: Diagnosis not present

## 2023-03-30 DIAGNOSIS — M25561 Pain in right knee: Secondary | ICD-10-CM | POA: Diagnosis not present

## 2023-03-30 DIAGNOSIS — M1711 Unilateral primary osteoarthritis, right knee: Secondary | ICD-10-CM | POA: Diagnosis not present

## 2023-03-30 DIAGNOSIS — M25661 Stiffness of right knee, not elsewhere classified: Secondary | ICD-10-CM | POA: Diagnosis not present

## 2023-03-30 DIAGNOSIS — R262 Difficulty in walking, not elsewhere classified: Secondary | ICD-10-CM | POA: Diagnosis not present

## 2023-04-06 DIAGNOSIS — M25561 Pain in right knee: Secondary | ICD-10-CM | POA: Diagnosis not present

## 2023-04-06 DIAGNOSIS — M1711 Unilateral primary osteoarthritis, right knee: Secondary | ICD-10-CM | POA: Diagnosis not present

## 2023-04-06 DIAGNOSIS — R262 Difficulty in walking, not elsewhere classified: Secondary | ICD-10-CM | POA: Diagnosis not present

## 2023-04-06 DIAGNOSIS — M25661 Stiffness of right knee, not elsewhere classified: Secondary | ICD-10-CM | POA: Diagnosis not present

## 2023-04-13 DIAGNOSIS — M25661 Stiffness of right knee, not elsewhere classified: Secondary | ICD-10-CM | POA: Diagnosis not present

## 2023-04-13 DIAGNOSIS — M25561 Pain in right knee: Secondary | ICD-10-CM | POA: Diagnosis not present

## 2023-04-13 DIAGNOSIS — R262 Difficulty in walking, not elsewhere classified: Secondary | ICD-10-CM | POA: Diagnosis not present

## 2023-04-13 DIAGNOSIS — M1711 Unilateral primary osteoarthritis, right knee: Secondary | ICD-10-CM | POA: Diagnosis not present

## 2023-05-10 DIAGNOSIS — Z23 Encounter for immunization: Secondary | ICD-10-CM | POA: Diagnosis not present

## 2023-05-11 DIAGNOSIS — R262 Difficulty in walking, not elsewhere classified: Secondary | ICD-10-CM | POA: Diagnosis not present

## 2023-05-11 DIAGNOSIS — M25561 Pain in right knee: Secondary | ICD-10-CM | POA: Diagnosis not present

## 2023-05-11 DIAGNOSIS — M25661 Stiffness of right knee, not elsewhere classified: Secondary | ICD-10-CM | POA: Diagnosis not present

## 2023-05-11 DIAGNOSIS — M1711 Unilateral primary osteoarthritis, right knee: Secondary | ICD-10-CM | POA: Diagnosis not present

## 2023-05-19 ENCOUNTER — Other Ambulatory Visit: Payer: Self-pay | Admitting: Physician Assistant

## 2023-06-21 DIAGNOSIS — E785 Hyperlipidemia, unspecified: Secondary | ICD-10-CM | POA: Diagnosis not present

## 2023-06-21 DIAGNOSIS — Z8673 Personal history of transient ischemic attack (TIA), and cerebral infarction without residual deficits: Secondary | ICD-10-CM | POA: Diagnosis not present

## 2023-06-25 ENCOUNTER — Other Ambulatory Visit: Payer: Self-pay | Admitting: Physician Assistant

## 2023-06-29 ENCOUNTER — Other Ambulatory Visit: Payer: Self-pay | Admitting: Physician Assistant

## 2023-07-07 DIAGNOSIS — G300 Alzheimer's disease with early onset: Secondary | ICD-10-CM | POA: Diagnosis not present

## 2023-07-07 DIAGNOSIS — Z0000 Encounter for general adult medical examination without abnormal findings: Secondary | ICD-10-CM | POA: Diagnosis not present

## 2023-07-07 DIAGNOSIS — G4733 Obstructive sleep apnea (adult) (pediatric): Secondary | ICD-10-CM | POA: Diagnosis not present

## 2023-07-07 DIAGNOSIS — F02B0 Dementia in other diseases classified elsewhere, moderate, without behavioral disturbance, psychotic disturbance, mood disturbance, and anxiety: Secondary | ICD-10-CM | POA: Diagnosis not present

## 2023-07-07 DIAGNOSIS — Z8673 Personal history of transient ischemic attack (TIA), and cerebral infarction without residual deficits: Secondary | ICD-10-CM | POA: Diagnosis not present

## 2023-07-07 DIAGNOSIS — Z7185 Encounter for immunization safety counseling: Secondary | ICD-10-CM | POA: Diagnosis not present

## 2023-07-07 DIAGNOSIS — I10 Essential (primary) hypertension: Secondary | ICD-10-CM | POA: Diagnosis not present

## 2023-07-07 DIAGNOSIS — E785 Hyperlipidemia, unspecified: Secondary | ICD-10-CM | POA: Diagnosis not present

## 2023-07-29 ENCOUNTER — Other Ambulatory Visit: Payer: Self-pay | Admitting: Physician Assistant

## 2023-08-18 ENCOUNTER — Other Ambulatory Visit: Payer: Self-pay | Admitting: Physician Assistant

## 2023-09-05 ENCOUNTER — Telehealth: Payer: Self-pay | Admitting: Physician Assistant

## 2023-09-05 ENCOUNTER — Other Ambulatory Visit: Payer: Self-pay

## 2023-09-05 MED ORDER — RIVASTIGMINE TARTRATE 1.5 MG PO CAPS: ORAL_CAPSULE | ORAL | 1 refills | Status: DC

## 2023-09-05 MED ORDER — MEMANTINE HCL 10 MG PO TABS: ORAL_TABLET | ORAL | 1 refills | Status: DC

## 2023-09-05 NOTE — Telephone Encounter (Signed)
 Sent rx's.

## 2023-09-05 NOTE — Telephone Encounter (Signed)
Pt wife called to get a refill on husbands medication. He will run out before his 09/09/23 appt.   Memantine 10mg  Rivastigmine 1.5mg   [facility] [address]

## 2023-09-08 DIAGNOSIS — R262 Difficulty in walking, not elsewhere classified: Secondary | ICD-10-CM | POA: Diagnosis not present

## 2023-09-08 DIAGNOSIS — M25661 Stiffness of right knee, not elsewhere classified: Secondary | ICD-10-CM | POA: Diagnosis not present

## 2023-09-08 DIAGNOSIS — M1711 Unilateral primary osteoarthritis, right knee: Secondary | ICD-10-CM | POA: Diagnosis not present

## 2023-09-08 DIAGNOSIS — M25561 Pain in right knee: Secondary | ICD-10-CM | POA: Diagnosis not present

## 2023-09-09 ENCOUNTER — Encounter: Payer: Self-pay | Admitting: Physician Assistant

## 2023-09-09 ENCOUNTER — Ambulatory Visit (INDEPENDENT_AMBULATORY_CARE_PROVIDER_SITE_OTHER): Payer: Medicare Other | Admitting: Physician Assistant

## 2023-09-09 VITALS — BP 147/73 | HR 66 | Resp 18 | Wt 197.0 lb

## 2023-09-09 DIAGNOSIS — G309 Alzheimer's disease, unspecified: Secondary | ICD-10-CM | POA: Diagnosis not present

## 2023-09-09 DIAGNOSIS — F0280 Dementia in other diseases classified elsewhere without behavioral disturbance: Secondary | ICD-10-CM | POA: Diagnosis not present

## 2023-09-09 MED ORDER — MEMANTINE HCL 10 MG PO TABS: ORAL_TABLET | ORAL | 3 refills | Status: AC

## 2023-09-09 MED ORDER — RIVASTIGMINE TARTRATE 3 MG PO CAPS: ORAL_CAPSULE | ORAL | 3 refills | Status: DC

## 2023-09-09 NOTE — Patient Instructions (Addendum)
 It was a pleasure to see you today at our office.   Recommendations:  Follow up in 6  months Continue memantine  10 mg 2 times a day    Increase rivastigmine   3 milligrams.  Take 1 capsule twice daily if tolerated.  Monitor driving Continue Plavix  75 mg a day   Whom to call:  Memory  decline, memory medications: Call our office [phone]   For psychiatric meds, mood meds: Please have your primary care physician manage these medications.    For assessment of decision of mental capacity and competency:  Call Dr. Rosaline Nine, geriatric psychiatrist at [phone]  For guidance in geriatric dementia issues please call [facility] [phone]  For guidance regarding [facility] and if placement were needed at the facility, contact Nat Hock, Social Worker tel: [phone]  If you have any severe symptoms of a stroke, or other severe issues such as confusion,severe chills or fever, etc call 911 or go to the ER as you may need to be evaluated further      RECOMMENDATIONS FOR ALL PATIENTS WITH MEMORY PROBLEMS: 1. Continue to exercise (Recommend 30 minutes of walking everyday, or 3 hours every week) 2. Increase social interactions - continue going to [place] and enjoy social gatherings with friends and family 3. Eat healthy, avoid fried foods and eat more fruits and vegetables 4. Maintain adequate blood pressure, blood sugar, and blood cholesterol level. Reducing the risk of stroke and cardiovascular disease also helps promoting better memory. 5. Avoid stressful situations. Live a simple life and avoid aggravations. Organize your time and prepare for the next day in anticipation. 6. Sleep well, avoid any interruptions of sleep and avoid any distractions in the bedroom that may interfere with adequate sleep quality 7. Avoid sugar, avoid sweets as there is a strong link between excessive sugar intake, diabetes, and cognitive impairment We discussed  the Mediterranean diet, which has been shown to help patients reduce the risk of progressive memory disorders and reduces cardiovascular risk. This includes eating fish, eat fruits and green leafy vegetables, nuts like almonds and hazelnuts, walnuts, and also use olive oil. Avoid fast foods and fried foods as much as possible. Avoid sweets and sugar as sugar use has been linked to worsening of memory function.  There is always a concern of gradual progression of memory problems. If this is the case, then we may need to adjust level of care according to patient needs. Support, both to the patient and caregiver, should then be put into place.    FALL PRECAUTIONS: Be cautious when walking. Scan the area for obstacles that may increase the risk of trips and falls. When getting up in the mornings, sit up at the edge of the bed for a few minutes before getting out of bed. Consider elevating the bed at the head end to avoid drop of blood pressure when getting up. Walk always in a well-lit room (use night lights in the walls). Avoid area rugs or power cords from appliances in the middle of the walkways. Use a walker or a cane if necessary and consider physical therapy for balance exercise. Get your eyesight checked regularly.  FINANCIAL OVERSIGHT: Supervision, especially oversight when making financial decisions or transactions is also recommended.  HOME SAFETY: Consider the safety of the kitchen when operating appliances like stoves, microwave oven, and blender. Consider having supervision and share cooking responsibilities until no longer able to participate in those. Accidents with firearms and other hazards in the  house should be identified and addressed as well.   ABILITY TO BE LEFT ALONE: If patient is unable to contact 911 operator, consider using LifeLine, or when the need is there, arrange for someone to stay with patients. Smoking is a fire hazard, consider supervision or cessation. Risk of wandering  should be assessed by caregiver and if detected at any point, supervision and safe proof recommendations should be instituted.  MEDICATION SUPERVISION: Inability to self-administer medication needs to be constantly addressed. Implement a mechanism to ensure safe administration of the medications.   DRIVING: Regarding driving, in patients with progressive memory problems, driving will be impaired. We advise to have someone else do the driving if trouble finding directions or if minor accidents are reported. Independent driving assessment is available to determine safety of driving.   If you are interested in the driving assessment, you can contact the following:  [employer] in [place] [phone]  [facility] [phone]  [facility] [phone]  [facility] [phone] or [phone]

## 2023-09-09 NOTE — Progress Notes (Signed)
 Assessment/Plan:   Dementia due to Alzheimer's disease   Ronnie Gonzalez is a very pleasant 83 y.o. RH male with a history of alcohol abuse, insomnia, OSA not on CPAP, peripheral polyneuropathy, hypertension, hyperlipidemia, CAD, anxiety, depression and a diagnosis of dementia due to Alzheimer's disease  as per Neuropsych evaluation 10/2022 seen today in follow up for memory loss. Patient is currently on memantine  10 mg twice daily (has side effects with donepezil ).  He is also on rivastigmine  1.5 mg twice daily, tolerating well. Cognitive decline is noted. MMSE today is 19/30. Discussed increasing rivastigmine  to 3 mg bid, goal 4.5 mg bid if tolerated . Mood is good    Follow up in 6 months. Continue rivastigmine , increase to 3 milligrams twice daily, side effects discussed Continue memantine  10 mg twice daily, side effects discussed Recommend good control of her cardiovascular risk factors.  Continue Plavix  75 mg daily Continue to control mood as per PCP Monitor driving, recommend discontinuing    Left Thalamic stroke likely secondary to Small Vessel Disease source, history of RLE DVT  No recurrence of any stroke-like symptoms   Continue Plavix  75 mg daily. Side effects discussed  Recommend good control of cardiovascular risk factors.      Subjective:    This patient is accompanied in the office by his wife who supplements the history.  Previous records as well as any outside records available were reviewed prior to todays visit. Patient was last seen on March 04, 2023    Any changes in memory since last visit?   Memory is worse -wife says.  He is has more difficulty with memory such as recent conversations, names and appointments. He does not talk much about the long term Watches the news and he forgets more quickly what they are talking about. repeats oneself?  Endorsed Disoriented when walking into a room?  Patient denies    Leaving objects?  May misplace things and then  cannot find it, even his pistachio nuts-wife says   Wandering behavior?  Denies.  He may get lost if walking by himself Any personality changes since last visit? Denies  Any worsening depression?:  Denies.   Hallucinations or paranoia?  Denies.   Seizures? denies    Any sleep changes?  Sleeps well.  Denies vivid dreams, REM behavior or sleepwalking   Sleep apnea?  Endorsed, does not use his CPAP. Any hygiene concerns?  Endorsed, he showers less frequently. Independent of bathing and dressing?  Endorsed  Does the patient needs help with medications?  Daughter and his wife is in charge  Who is in charge of the finances?  Son from [place]  is in charge    Any changes in appetite?  He is eating more, his wife prepares the meals.    Patient have trouble swallowing? Denies.   Does the patient cook? No Any headaches?   denies   Chronic back pain  denies   Ambulates with difficulty?  He likes to walk his dog, gets cortisone shots on the R knee. He follows Ortho.  Recent falls or head injuries? denies     Unilateral weakness, numbness or tingling? denies   Any tremors?  Denies   Any anosmia?  Denies   Any incontinence of urine?  Endorsed but refuses to wear pads or diapers. Any bowel dysfunction?   Denies      Patient lives with his wife  Does the patient drive? 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 Waiting for his birthday in August at which time his license expires.   Alcohol?  He drinks alcohol free beer and wine, sometime tonic-vodka   Initial Visit 05/08/21 The patient is seen in neurologic consultation at the request of Frederik Charleston, MD for the evaluation of memory.  The patient is accompanied by wife who supplements the history. This is a 83 y.o. year old male who has had memory issues for about one year, worse since February of this year.  His wife noted that he was making mistakes regarding appointments.  For example, he was called to confirm  this appointment, and he told his wife that he has some appointment in [facility], but he could not remember who had call him what was there for.  He forgets quickly his instructions. His wife states that he is unable to carry a detailed conversation and if there are several people in the conversation he becomes a listener, he does not enjoy partaking in it.  He will usually respond with generic one-liners.  When playing family games, he cannot follow below the directions of the game, but can certainly follow the directions if broken down into steps when his turn comes.  His memories of the past are skewed.  In the recent conversation regarding 911, he stated that he volunteered in the aftermath, which was not true. He then agreed that he was not there ( he is a retired Animal Nutritionist).  He occasionally leaves the house to run an errand returning home empty handed because he forgot what was the reason he was leaving the house in the first place.     He repeats the same questions, or stories.  His mood is good, denies any depression, or irritability.  He sleeps well with the help of sleep aids.  He denies any vivid dreams or sleepwalking, hallucinations or paranoia or leaving objects in unusual places.  He is independent of bathing and dressing, he is compliant with his medications, denies any issues with finances.  His appetite is good, denies trouble swallowing.  His wife always cooked.  He ambulates without difficulty without the use of a walker or a cane.  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  He continues to drive, but he may become lost at times.   He denies any headaches, double vision, dizziness, focal numbness or tingling, unilateral weakness or tremors, urine incontinence or retention, denies constipation or diarrhea.  Denies anosmia.  Denies a history of sleep apnea.  He continues to drink 2 large cans of beer a day, as well as a repetitive after  dinner.  He denies tobacco abuse.  Family history remarkable for one uncle, father and grandfather with dementia. Neuropsychological exam prior to this appointment (12/01/20) showed significant memory problems likely due to storage problem, concerning for mild cognitive impairment being at risk for Alzheimer's clinical syndrome with questionable component of Warnicke-Korsakoff given his confabulation and level of alcohol consumption.    Left thalamic stroke likely secondary to small vessel disease source   Patient presented with entire right upper extremity and right lower extremity below the level of the knee tingling for several weeks, without facial involvement, or weakness.  He denies vision changes or problems with speech.  He never had a stroke before. Code stroke was called and CT of the head showed no acute abnormalities.  CT angio of the head and neck was negative  for large vessel occlusion, mild for age atheromatous change about the carotid bifurcations and carotid siphons without hemodynamically significant or correctable stenosis was noted.  MRI of the brain was remarkable for 1.5 cm acute ischemic nonhemorrhagic left thalamic infarct.  2D echo showed normal EF between 55 and 60%, with normal function.  LDL was 88, Crestor  20 mg daily was added, with goal of LDL less than 70.  He was also placed on DAPT for 3 weeks and then was supposed to be on Plavix  alone thereafter but he resumed ASA alone instead.  Because of numbness in his right leg, the patient should not drive.       Labs  04/29/21 LDL 101, TSH 0.86, MCV 95.5  o/w nl CBC, B1 240.09, B12 352   MRI brain wo contrast 05/09/21 No evidence of acute intracranial abnormality. Mild chronic small vessel ischemic changes within the cerebral white matter.Moderate generalized cerebral atrophy. Comparatively mild cerebellar  atrophy. Signal abnormality is questioned within the distal cervical right internal carotid artery (at the very caudal extent of  the field of view).    CT angio neck w and wo contrast 05/14/21 No hemodynamically significant stenosis in the neck.  PREVIOUS MEDICATIONS: Donepezil  (diarrhea)     Neuropsychological evaluation 10/05/2022. Briefly, results suggested severe impairment surrounding all aspects of delayed retrieval and recognition/consolidation aspects of memory. Additional weaknesses were exhibited across processing speed and executive functioning, while performance variability was exhibited across both encoding (i.e., learning) aspects of memory and confrontation naming. Relative to his previous neuropsychological evaluation in April 2022, mild declines were seen across memory abilities, particularly retention rates after brief delays. Regarding the underlying etiology, I share Dr. Darlena previous concerns surrounding Alzheimer's disease and unfortunately feel that this is the most likely explanation for ongoing impairment. Despite showing an ability to learn some verbal information adequately, he was fully amnestic (i.e., 0% retention) across all memory tasks after a brief delay. He also performed very poorly across yes/no recognition trials. Together, this suggests evidence for rapid forgetting, as well as a quite pronounced storage impairment, both of which are the hallmark characteristics of this illness. Additional variability across confrontation naming, as well as a discrepancy between phonemic and semantic fluency (with worse performances across the latter) are further consistent with typical disease progression.   PREVIOUS MEDICATIONS: Donepezil  (GI)  CURRENT MEDICATIONS:  Outpatient Encounter Medications as of 09/09/2023  Medication Sig   Ascorbic Acid (VITAMIN C) 1000 MG tablet Take 1,000 mg by mouth daily.   Cholecalciferol (VITAMIN D3 SUPER STRENGTH) 50 MCG (2000 UT) CAPS Take 2,000 Units by mouth daily.   clopidogrel  (PLAVIX ) 75 MG tablet TAKE 1 TABLET BY MOUTH EVERY DAY   Cyanocobalamin (VITAMIN B-12)  2500 MCG SUBL Place 2,500 mcg under the tongue daily.   GLUCOSAMINE-CHONDROITIN PO Take 1 capsule by mouth daily.   Magnesium 250 MG TABS Take 250 mg by mouth daily.   memantine  (NAMENDA ) 10 MG tablet TAKE 1 TABLET BY MOUTH TWICE A DAY   Multiple Vitamin (MULTIVITAMIN WITH MINERALS) TABS Take 1 tablet by mouth daily.   Omega-3 Fatty Acids (FISH OIL) 1000 MG CPDR Take 1,000 mg by mouth daily.   rivastigmine  (EXELON ) 3 MG capsule TAKE 1 CAPSULE  TWICE A DAY   vitamin E 180 MG (400 UNITS) capsule Take 400 Units by mouth daily.   zinc gluconate 50 MG tablet Take 50 mg by mouth daily.   rosuvastatin  (CRESTOR ) 20 MG tablet Take 1 tablet (20 mg total) by mouth  daily.   [DISCONTINUED] memantine  (NAMENDA ) 10 MG tablet TAKE 1 TABLET BY MOUTH TWICE A DAY   [DISCONTINUED] rivastigmine  (EXELON ) 1.5 MG capsule TAKE 1 CAPSULE AT NIGHT FOR 2 WEEKS THEN TAKE 1 CAPSULE TWICE A DAY   No facility-administered encounter medications on file as of 09/09/2023.       09/09/2023    9:00 AM 03/03/2022   12:00 PM 12/09/2021    9:00 AM  MMSE - Mini Mental State Exam  Orientation to time 0 1 4  Orientation to Place 2 5 5   Registration 3 3 3   Attention/ Calculation 5 5 4   Recall 0 0 0  Language- name 2 objects 2 2 2   Language- repeat 1 1 1   Language- follow 3 step command 3 3 3   Language- read & follow direction 1 1 1   Write a sentence 1 1 1   Copy design 1 1 1   Total score 19 23 25        No data to display          Objective:     PHYSICAL EXAMINATION:    VITALS:   Vitals:   09/09/23 0851  BP: (!) 147/73  Pulse: 66  Resp: 18  SpO2: 96%  Weight: 197 lb (89.4 kg)    GEN:  The patient appears stated age and is in NAD. HEENT:  Normocephalic, atraumatic.   Neurological examination:  General: NAD, well-groomed, appears stated age. Orientation: The patient is alert. Oriented to person,not to place or date Cranial nerves: There is good facial symmetry.The speech is fluent and clear. No aphasia or  dysarthria. Fund of knowledge is reduced. Recent and remote memory are impaired. Attention and concentration are reduced.  Able to name objects and repeat phrases.  Hearing is intact to conversational tone.  Delayed recall is 0 Sensation: Sensation is intact to light touch throughout Motor: Strength is at least antigravity x4. DTR's 2/4 in UE/LE     Movement examination: Tone: There is normal tone in the UE/LE, no cogwheeling Abnormal movements: Slight chronic left hand tremor.  No myoclonus.  No asterixis.   Coordination:  There is no decremation with RAM's. Normal finger to nose  Gait and Station: The patient has no difficulty arising out of a deep-seated chair without the use of the hands. The patient's stride length is good.  Gait is cautious and narrow.    Thank you for allowing us  the opportunity to participate in the care of this nice patient. Please do not hesitate to contact us  for any questions or concerns.   Total time spent on today's visit was 30 minutes dedicated to this patient today, preparing to see patient, examining the patient, ordering tests and/or medications and counseling the patient, documenting clinical information in the EHR or other health record, independently interpreting results and communicating results to the patient/family, discussing treatment and goals, answering patient's questions and coordinating care.  Cc:  Cindy Clotilda CHRISTELLA ROSALEA [facility] 09/09/2023 9:09 AM

## 2023-09-21 DIAGNOSIS — M25661 Stiffness of right knee, not elsewhere classified: Secondary | ICD-10-CM | POA: Diagnosis not present

## 2023-09-21 DIAGNOSIS — M25561 Pain in right knee: Secondary | ICD-10-CM | POA: Diagnosis not present

## 2023-09-21 DIAGNOSIS — R262 Difficulty in walking, not elsewhere classified: Secondary | ICD-10-CM | POA: Diagnosis not present

## 2023-09-21 DIAGNOSIS — M1711 Unilateral primary osteoarthritis, right knee: Secondary | ICD-10-CM | POA: Diagnosis not present

## 2023-09-23 DIAGNOSIS — L821 Other seborrheic keratosis: Secondary | ICD-10-CM | POA: Diagnosis not present

## 2023-09-23 DIAGNOSIS — D2271 Melanocytic nevi of right lower limb, including hip: Secondary | ICD-10-CM | POA: Diagnosis not present

## 2023-09-23 DIAGNOSIS — L570 Actinic keratosis: Secondary | ICD-10-CM | POA: Diagnosis not present

## 2023-09-23 DIAGNOSIS — D225 Melanocytic nevi of trunk: Secondary | ICD-10-CM | POA: Diagnosis not present

## 2023-09-23 DIAGNOSIS — Z808 Family history of malignant neoplasm of other organs or systems: Secondary | ICD-10-CM | POA: Diagnosis not present

## 2023-09-23 DIAGNOSIS — L578 Other skin changes due to chronic exposure to nonionizing radiation: Secondary | ICD-10-CM | POA: Diagnosis not present

## 2023-09-23 DIAGNOSIS — Z86018 Personal history of other benign neoplasm: Secondary | ICD-10-CM | POA: Diagnosis not present

## 2023-09-23 DIAGNOSIS — D1800 Hemangioma unspecified site: Secondary | ICD-10-CM | POA: Diagnosis not present

## 2023-09-23 DIAGNOSIS — Z85828 Personal history of other malignant neoplasm of skin: Secondary | ICD-10-CM | POA: Diagnosis not present

## 2023-10-13 DIAGNOSIS — R262 Difficulty in walking, not elsewhere classified: Secondary | ICD-10-CM | POA: Diagnosis not present

## 2023-10-13 DIAGNOSIS — M25561 Pain in right knee: Secondary | ICD-10-CM | POA: Diagnosis not present

## 2023-10-13 DIAGNOSIS — M1711 Unilateral primary osteoarthritis, right knee: Secondary | ICD-10-CM | POA: Diagnosis not present

## 2023-10-13 DIAGNOSIS — M25661 Stiffness of right knee, not elsewhere classified: Secondary | ICD-10-CM | POA: Diagnosis not present

## 2023-10-20 DIAGNOSIS — R262 Difficulty in walking, not elsewhere classified: Secondary | ICD-10-CM | POA: Diagnosis not present

## 2023-10-20 DIAGNOSIS — M1711 Unilateral primary osteoarthritis, right knee: Secondary | ICD-10-CM | POA: Diagnosis not present

## 2023-10-20 DIAGNOSIS — M25561 Pain in right knee: Secondary | ICD-10-CM | POA: Diagnosis not present

## 2023-10-20 DIAGNOSIS — M25661 Stiffness of right knee, not elsewhere classified: Secondary | ICD-10-CM | POA: Diagnosis not present

## 2023-10-27 DIAGNOSIS — R262 Difficulty in walking, not elsewhere classified: Secondary | ICD-10-CM | POA: Diagnosis not present

## 2023-10-27 DIAGNOSIS — M1711 Unilateral primary osteoarthritis, right knee: Secondary | ICD-10-CM | POA: Diagnosis not present

## 2023-10-27 DIAGNOSIS — M25561 Pain in right knee: Secondary | ICD-10-CM | POA: Diagnosis not present

## 2023-10-27 DIAGNOSIS — M25661 Stiffness of right knee, not elsewhere classified: Secondary | ICD-10-CM | POA: Diagnosis not present

## 2023-11-03 DIAGNOSIS — M25561 Pain in right knee: Secondary | ICD-10-CM | POA: Diagnosis not present

## 2023-11-03 DIAGNOSIS — R262 Difficulty in walking, not elsewhere classified: Secondary | ICD-10-CM | POA: Diagnosis not present

## 2023-11-03 DIAGNOSIS — M1711 Unilateral primary osteoarthritis, right knee: Secondary | ICD-10-CM | POA: Diagnosis not present

## 2023-11-03 DIAGNOSIS — M25661 Stiffness of right knee, not elsewhere classified: Secondary | ICD-10-CM | POA: Diagnosis not present

## 2023-11-10 DIAGNOSIS — M25661 Stiffness of right knee, not elsewhere classified: Secondary | ICD-10-CM | POA: Diagnosis not present

## 2023-11-10 DIAGNOSIS — M25561 Pain in right knee: Secondary | ICD-10-CM | POA: Diagnosis not present

## 2023-11-10 DIAGNOSIS — M1711 Unilateral primary osteoarthritis, right knee: Secondary | ICD-10-CM | POA: Diagnosis not present

## 2023-11-10 DIAGNOSIS — R262 Difficulty in walking, not elsewhere classified: Secondary | ICD-10-CM | POA: Diagnosis not present

## 2023-11-12 ENCOUNTER — Other Ambulatory Visit: Payer: Self-pay | Admitting: Physician Assistant

## 2023-12-22 DIAGNOSIS — M25561 Pain in right knee: Secondary | ICD-10-CM | POA: Diagnosis not present

## 2023-12-22 DIAGNOSIS — M25661 Stiffness of right knee, not elsewhere classified: Secondary | ICD-10-CM | POA: Diagnosis not present

## 2023-12-22 DIAGNOSIS — M1711 Unilateral primary osteoarthritis, right knee: Secondary | ICD-10-CM | POA: Diagnosis not present

## 2023-12-22 DIAGNOSIS — R262 Difficulty in walking, not elsewhere classified: Secondary | ICD-10-CM | POA: Diagnosis not present

## 2024-01-20 ENCOUNTER — Encounter: Payer: Self-pay | Admitting: Physician Assistant

## 2024-01-20 ENCOUNTER — Ambulatory Visit (INDEPENDENT_AMBULATORY_CARE_PROVIDER_SITE_OTHER): Admitting: Physician Assistant

## 2024-01-20 VITALS — BP 143/75 | HR 98 | Ht 70.5 in | Wt 193.0 lb

## 2024-01-20 DIAGNOSIS — F0280 Dementia in other diseases classified elsewhere without behavioral disturbance: Secondary | ICD-10-CM

## 2024-01-20 DIAGNOSIS — G309 Alzheimer's disease, unspecified: Secondary | ICD-10-CM

## 2024-01-20 MED ORDER — RIVASTIGMINE TARTRATE 4.5 MG PO CAPS: ORAL_CAPSULE | ORAL | 3 refills | Status: DC

## 2024-01-20 NOTE — Patient Instructions (Signed)
 It was a pleasure to see you today at our office.   Recommendations:  Follow up in 6  months Continue memantine  10 mg 2 times a day    Increase rivastigmine   to 4.5 milligrams.  Take 1 capsule twice daily if tolerated.  Monitor driving Continue Plavix  75 mg a day   Whom to call:  Memory  decline, memory medications: Call our office [phone]   For psychiatric meds, mood meds: Please have your primary care physician manage these medications.    For assessment of decision of mental capacity and competency:  Call Dr. Laverne Potter, geriatric psychiatrist at [phone]  For guidance in geriatric dementia issues please call [facility] [phone]  For guidance regarding [facility] and if placement were needed at the facility, contact Forrestine Ike, Social Worker tel: [phone]  If you have any severe symptoms of a stroke, or other severe issues such as confusion,severe chills or fever, etc call 911 or go to the ER as you may need to be evaluated further      RECOMMENDATIONS FOR ALL PATIENTS WITH MEMORY PROBLEMS: 1. Continue to exercise (Recommend 30 minutes of walking everyday, or 3 hours every week) 2. Increase social interactions - continue going to [place] and enjoy social gatherings with friends and family 3. Eat healthy, avoid fried foods and eat more fruits and vegetables 4. Maintain adequate blood pressure, blood sugar, and blood cholesterol level. Reducing the risk of stroke and cardiovascular disease also helps promoting better memory. 5. Avoid stressful situations. Live a simple life and avoid aggravations. Organize your time and prepare for the next day in anticipation. 6. Sleep well, avoid any interruptions of sleep and avoid any distractions in the bedroom that may interfere with adequate sleep quality 7. Avoid sugar, avoid sweets as there is a strong link between excessive sugar intake, diabetes, and cognitive impairment We  discussed the Mediterranean diet, which has been shown to help patients reduce the risk of progressive memory disorders and reduces cardiovascular risk. This includes eating fish, eat fruits and green leafy vegetables, nuts like almonds and hazelnuts, walnuts, and also use olive oil. Avoid fast foods and fried foods as much as possible. Avoid sweets and sugar as sugar use has been linked to worsening of memory function.  There is always a concern of gradual progression of memory problems. If this is the case, then we may need to adjust level of care according to patient needs. Support, both to the patient and caregiver, should then be put into place.    FALL PRECAUTIONS: Be cautious when walking. Scan the area for obstacles that may increase the risk of trips and falls. When getting up in the mornings, sit up at the edge of the bed for a few minutes before getting out of bed. Consider elevating the bed at the head end to avoid drop of blood pressure when getting up. Walk always in a well-lit room (use night lights in the walls). Avoid area rugs or power cords from appliances in the middle of the walkways. Use a walker or a cane if necessary and consider physical therapy for balance exercise. Get your eyesight checked regularly.  FINANCIAL OVERSIGHT: Supervision, especially oversight when making financial decisions or transactions is also recommended.  HOME SAFETY: Consider the safety of the kitchen when operating appliances like stoves, microwave oven, and blender. Consider having supervision and share cooking responsibilities until no longer able to participate in those. Accidents with firearms and other hazards in  the house should be identified and addressed as well.   ABILITY TO BE LEFT ALONE: If patient is unable to contact 911 operator, consider using LifeLine, or when the need is there, arrange for someone to stay with patients. Smoking is a fire hazard, consider supervision or cessation. Risk of  wandering should be assessed by caregiver and if detected at any point, supervision and safe proof recommendations should be instituted.  MEDICATION SUPERVISION: Inability to self-administer medication needs to be constantly addressed. Implement a mechanism to ensure safe administration of the medications.   DRIVING: Regarding driving, in patients with progressive memory problems, driving will be impaired. We advise to have someone else do the driving if trouble finding directions or if minor accidents are reported. Independent driving assessment is available to determine safety of driving.   If you are interested in the driving assessment, you can contact the following:  [employer] in [place] [phone]  [facility] [phone]  [facility] [phone]  [facility] [phone] or [phone]
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
## 2024-02-02 ENCOUNTER — Ambulatory Visit: Admitting: Family Medicine

## 2024-02-17 ENCOUNTER — Other Ambulatory Visit: Payer: Self-pay | Admitting: Physician Assistant

## 2024-03-09 ENCOUNTER — Ambulatory Visit: Payer: Self-pay | Admitting: Physician Assistant

## 2024-03-12 ENCOUNTER — Other Ambulatory Visit: Payer: Self-pay

## 2024-03-12 ENCOUNTER — Telehealth: Payer: Self-pay | Admitting: Physician Assistant

## 2024-03-12 MED ORDER — RIVASTIGMINE TARTRATE 4.5 MG PO CAPS: ORAL_CAPSULE | ORAL | 3 refills | Status: AC

## 2024-03-12 NOTE — Telephone Encounter (Signed)
 I left message to call office to confirm dosage of medications, then will forward to provider, meds were adjusted last visit.

## 2024-03-12 NOTE — Telephone Encounter (Signed)
 I sent increased dose to pharmacy, [address].

## 2024-03-12 NOTE — Telephone Encounter (Signed)
 Pt wife called in and stated that she needs her husband prescription increase to 4.5. Pt stated [facility] [address] still showing 3mg .

## 2024-03-12 NOTE — Telephone Encounter (Signed)
 Pt's wife called in and left message with after hours service. Prescription was updated. Need stronger dosage. Wants [facility] to call [facility].

## 2024-04-21 DIAGNOSIS — Z23 Encounter for immunization: Secondary | ICD-10-CM | POA: Diagnosis not present

## 2024-05-15 ENCOUNTER — Other Ambulatory Visit: Payer: Self-pay | Admitting: Physician Assistant

## 2024-05-25 DIAGNOSIS — Z23 Encounter for immunization: Secondary | ICD-10-CM | POA: Diagnosis not present
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
## 2024-07-23 ENCOUNTER — Encounter: Payer: Self-pay | Admitting: Physician Assistant

## 2024-07-23 ENCOUNTER — Ambulatory Visit: Admitting: Physician Assistant

## 2024-07-23 VITALS — BP 159/77 | HR 62 | Resp 20 | Ht 70.5 in | Wt 191.0 lb

## 2024-07-23 DIAGNOSIS — G309 Alzheimer's disease, unspecified: Secondary | ICD-10-CM

## 2024-07-23 DIAGNOSIS — F0280 Dementia in other diseases classified elsewhere without behavioral disturbance: Secondary | ICD-10-CM

## 2024-07-23 NOTE — Patient Instructions (Signed)
 It was a pleasure to see you today at our office.   Recommendations:  Follow up in 6  months Continue memantine  10 mg 2 times a day    Hold rivastigmine   to 4.5 milligrams for 3 days and observe symptoms.    Continue Plavix  75 mg a day        RECOMMENDATIONS FOR ALL PATIENTS WITH MEMORY PROBLEMS: 1. Continue to exercise (Recommend 30 minutes of walking everyday, or 3 hours every week) 2. Increase social interactions - continue going to [place] and enjoy social gatherings with friends and family 3. Eat healthy, avoid fried foods and eat more fruits and vegetables 4. Maintain adequate blood pressure, blood sugar, and blood cholesterol level. Reducing the risk of stroke and cardiovascular disease also helps promoting better memory. 5. Avoid stressful situations. Live a simple life and avoid aggravations. Organize your time and prepare for the next day in anticipation. 6. Sleep well, avoid any interruptions of sleep and avoid any distractions in the bedroom that may interfere with adequate sleep quality 7. Avoid sugar, avoid sweets as there is a strong link between excessive sugar intake, diabetes, and cognitive impairment We discussed the Mediterranean diet, which has been shown to help patients reduce the risk of progressive memory disorders and reduces cardiovascular risk. This includes eating fish, eat fruits and green leafy vegetables, nuts like almonds and hazelnuts, walnuts, and also use olive oil. Avoid fast foods and fried foods as much as possible. Avoid sweets and sugar as sugar use has been linked to worsening of memory function.  There is always a concern of gradual progression of memory problems. If this is the case, then we may need to adjust level of care according to patient needs. Support, both to the patient and caregiver, should then be put into place.    FALL PRECAUTIONS: Be cautious when walking. Scan the area for obstacles that may increase the risk of trips and falls.  When getting up in the mornings, sit up at the edge of the bed for a few minutes before getting out of bed. Consider elevating the bed at the head end to avoid drop of blood pressure when getting up. Walk always in a well-lit room (use night lights in the walls). Avoid area rugs or power cords from appliances in the middle of the walkways. Use a walker or a cane if necessary and consider physical therapy for balance exercise. Get your eyesight checked regularly.  FINANCIAL OVERSIGHT: Supervision, especially oversight when making financial decisions or transactions is also recommended.  HOME SAFETY: Consider the safety of the kitchen when operating appliances like stoves, microwave oven, and blender. Consider having supervision and share cooking responsibilities until no longer able to participate in those. Accidents with firearms and other hazards in the house should be identified and addressed as well.   ABILITY TO BE LEFT ALONE: If patient is unable to contact 911 operator, consider using [employer], or when the need is there, arrange for someone to stay with patients. Smoking is a fire hazard, consider supervision or cessation. Risk of wandering should be assessed by caregiver and if detected at any point, supervision and safe proof recommendations should be instituted.  MEDICATION SUPERVISION: Inability to self-administer medication needs to be constantly addressed. Implement a mechanism to ensure safe administration of the medications.   DRIVING: Regarding driving, in patients with progressive memory problems, driving will be impaired. We advise to have someone else do the driving if trouble finding directions or if minor accidents  are reported. Independent driving assessment is available to determine safety of driving.   If you are interested in the driving assessment, you can contact the following:  [employer] in [place] [phone]  [facility]  [phone]  [facility] [phone]  [facility] [phone] or [phone]

## 2024-08-12 ENCOUNTER — Other Ambulatory Visit: Payer: Self-pay | Admitting: Physician Assistant

## 2025-01-21 ENCOUNTER — Ambulatory Visit: Payer: Self-pay | Admitting: Physician Assistant
